# Patient Record
Sex: Male | Born: 1935 | Race: Asian | Hispanic: No | Marital: Married | Smoking: Former smoker
Health system: Southern US, Community
[De-identification: ages and names within clinical notes are randomized; demographics above are authoritative.]

## PROBLEM LIST (undated history)

## (undated) DIAGNOSIS — T8859XA Other complications of anesthesia, initial encounter: Secondary | ICD-10-CM

## (undated) DIAGNOSIS — T4145XA Adverse effect of unspecified anesthetic, initial encounter: Secondary | ICD-10-CM

## (undated) DIAGNOSIS — IMO0001 Reserved for inherently not codable concepts without codable children: Secondary | ICD-10-CM

## (undated) DIAGNOSIS — I10 Essential (primary) hypertension: Secondary | ICD-10-CM

## (undated) DIAGNOSIS — M1990 Unspecified osteoarthritis, unspecified site: Secondary | ICD-10-CM

## (undated) HISTORY — PX: CHOLECYSTECTOMY: SHX55

## (undated) HISTORY — DX: Essential (primary) hypertension: I10

---

## 1978-12-21 HISTORY — PX: INGUINAL HERNIA REPAIR: SUR1180

## 1998-04-01 ENCOUNTER — Ambulatory Visit (HOSPITAL_COMMUNITY): Admission: RE | Admit: 1998-04-01 | Discharge: 1998-04-01 | Payer: Self-pay | Admitting: Infectious Diseases

## 2000-03-31 ENCOUNTER — Inpatient Hospital Stay (HOSPITAL_COMMUNITY): Admission: EM | Admit: 2000-03-31 | Discharge: 2000-04-03 | Payer: Self-pay | Admitting: Emergency Medicine

## 2000-03-31 ENCOUNTER — Encounter: Payer: Self-pay | Admitting: Emergency Medicine

## 2000-03-31 ENCOUNTER — Encounter: Payer: Self-pay | Admitting: Internal Medicine

## 2002-12-21 HISTORY — PX: CHOLECYSTECTOMY, LAPAROSCOPIC: SHX56

## 2005-01-15 ENCOUNTER — Encounter (INDEPENDENT_AMBULATORY_CARE_PROVIDER_SITE_OTHER): Payer: Self-pay | Admitting: *Deleted

## 2005-01-15 ENCOUNTER — Ambulatory Visit (HOSPITAL_COMMUNITY): Admission: RE | Admit: 2005-01-15 | Discharge: 2005-01-15 | Payer: Self-pay | Admitting: Gastroenterology

## 2005-07-31 ENCOUNTER — Ambulatory Visit (HOSPITAL_COMMUNITY): Admission: RE | Admit: 2005-07-31 | Discharge: 2005-07-31 | Payer: Self-pay | Admitting: Internal Medicine

## 2005-09-10 ENCOUNTER — Encounter: Admission: RE | Admit: 2005-09-10 | Discharge: 2005-12-09 | Payer: Self-pay | Admitting: Specialist

## 2006-12-21 HISTORY — PX: COLONOSCOPY: SHX174

## 2008-11-19 ENCOUNTER — Ambulatory Visit: Payer: Self-pay | Admitting: Internal Medicine

## 2008-11-28 ENCOUNTER — Ambulatory Visit: Payer: Self-pay

## 2008-11-28 ENCOUNTER — Ambulatory Visit: Payer: Self-pay | Admitting: Internal Medicine

## 2008-11-28 LAB — CONVERTED CEMR LAB
Cholesterol: 207 mg/dL (ref 0–200)
Direct LDL: 159.7 mg/dL
HDL: 31.5 mg/dL — ABNORMAL LOW (ref >39.0)
Total CHOL/HDL Ratio: 6.6
Triglycerides: 92 mg/dL (ref 0–149)
VLDL: 18 mg/dL (ref 0–40)

## 2008-11-30 ENCOUNTER — Encounter: Payer: Self-pay | Admitting: Internal Medicine

## 2008-11-30 ENCOUNTER — Ambulatory Visit: Payer: Self-pay

## 2011-05-05 NOTE — Assessment & Plan Note (Signed)
[facility]                            CARDIOLOGY OFFICE NOTE   NAME:[name], Jeffrey                        MRN:          [id number]  DATE:11/19/2008                            DOB:          1936/08/28    IDENTIFICATION:  [name] is a 75 year old gentleman, who is referred by  Marisue Brooklyn for evaluation of shortness of breath.   HISTORY OF PRESENT ILLNESS:  The patient has no known history of  coronary artery disease.  He works at [employer] in [employer].  One day  at work, he was pushing a heavy load and became short of breath and felt  his heart beating fast.  He states he has noted that when he walks  quickly, he gets tired out, short of breath.  He does not climb stairs  and when I asked him if he gets any chest pressure, he denies this.  He  states about 6 or 12 months ago, he did not have problems like he has  now.  He has not woken up with any of this.  He denies any presyncope or  syncope.   ALLERGIES:  None.   MEDICATIONS:  Nasal spray for congestion nightly.   PAST MEDICAL HISTORY:  Allergies.   PAST SURGICAL HISTORY:  Cholecystectomy and hernia repair.   SOCIAL HISTORY:  The patient is married, works in Pharmacologist at [employer]  Quit tobacco in 1999 after smoking two cigarettes per day for  10-15 years.  Drinks rarely.   FAMILY HISTORY:  Mother died at age 76 of an appendicitis.  Father died  at age 49 of old age.  One sister died at age 73.  One brother died at  age 6.  One brother died at age 5 of cardiac arrest; 2 died at young  age, one in the war and one killed.  Family history without any sudden  cardiac death besides the one sibling.   REVIEW OF SYSTEMS:  All systems reviewed.  Negative to the above problem  except as noted.   PHYSICAL EXAMINATION:  GENERAL:  The patient is in no distress at rest.  VITAL SIGNS:  His blood pressure is 149/78, pulse is 72 and regular, and  weight 145.  HEENT:  Normocephalic and  atraumatic.  EOMI.  PERRL.  Mucous membranes  are moist.  NECK:  JVP is normal.  No thyromegaly or bruits.  No lymphadenopathy.  LUNGS:  Clear without rales or wheezes.  Moving air well.  CARDIAC:  Regular rate and rhythm.  S1 and S2.  No S3, S4, or murmurs  noted.  PMI not displaced.  ABDOMEN:  Supple and nontender.  Normal bowel sounds.  No hepatomegaly.  No masses.  EXTREMITIES:  Good distal pulses throughout.  No lower extremity edema.  NEUROLOGIC:  Alert and oriented x3.  Cranial nerves II through XII  grossly intact.  Moving all extremities.   A 12-lead EKG shows normal sinus rhythm, 72 beats per minute.  Incomplete right bundle-branch block.  QT is normal.   IMPRESSION:  [name] is a 75 year old gentleman  with no known cardiac  history, noting increased shortness of breath recently and his heart  racing with activity.  On examination today, I do not see any evidence  for congestive heart failure.  EKG is relatively nondiagnostic with his  family history of sudden death, I do not see any lengthening of the QT  interval.   His risk factors include age plus/minus family history.  He does note  his cholesterol has been high, checked at recent hospital screening.  What I have recommended is:  1. Schedule an echocardiogram to evaluate left ventricular size,      function, also to evaluate diastolic function.  2. Stress Myoview to evaluate his coronary perfusion.  3. A fasting lipid panel.  I will be in touch with the patient      regarding the results.   ADDENDUM:  The patient to continue on activities as tolerated only, also  to start a baby aspirin.     Pricilla Riffle, MD, [facility]  Electronically Signed    PVR/MedQ  DD: 11/19/2008  DT: 11/20/2008  Job #: [id number]   cc:   Lovenia Kim, D.O.

## 2011-05-08 NOTE — H&P (Signed)
[place]. [facility]  Patient:    Jeffrey [name], Jeffrey [name]                        MRN: [id number] Adm. Date:  [id number] Attending:  Dolores Patty CC:         Mahala Menghini. Evonnie Dawes, M.D. [facility]                         History and Physical  CHIEF COMPLAINT: Jeffrey [name] is a 75 year old [place] gentleman who is employed at [employer], who presents with constant abdominal pain for two days.  HISTORY OF PRESENT ILLNESS: This usually is epigastric with minimal radiation although in the morning he has noted some superior radiation.  He had vomiting f clear material this morning but otherwise denies significant nausea or diaphoresis. He rates this as a 5 on a scale of 1-10, of moderate severity.  It seems to be aggravated with upright position, walking, or cough.  It is minimally improved ith food intake or antacids.  He denies frank chest pain or anginal equivalent type symptoms.  He denies rectal bleeding, melena, or bowel change.  PAST MEDICAL HISTORY: Includes herniorrhaphy.  MEDICATIONS: He is on no medications.  It is difficult to obtain a history.  Although he is motivated and extremely polite and his English is good, at times he did not appear to understand my questions.  Apparently he had been treated with some "stomach medicine" by Dr. Evonnie Dawes in the  recent past.  ALLERGIES: No known drug allergies.  SOCIAL HISTORY: He does not smoke.  He drinks one alcoholic beverage a month on  average.  He has been in the [place] for two years, coming here to live to be near his daughter.  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  REVIEW OF SYSTEMS: Includes recent respiratory tract infection.  At this time he denies any fever, chills, sweats, or purulent secretions.  He denies any genitourinary symptoms.  He has had no rash and, as stated, no diarrhea.  PHYSICAL EXAMINATION:  GENERAL: On examination he is in no acute  distress.  VITAL SIGNS: Pulse 57 and regular, blood pressure 147/85, respiratory rate 18.  HEENT/NECK: No lymphadenopathy of head, neck, or axilla.  Thyroid is small. Funduscopic examination revealed essentially normal vessels.  Oropharyngeal and  laryngeal examination essentially negative.  He has an upper dental plate and lower partial.  CARDIAC: Grade 1 holosystolic murmur at the left sternal border.  CHEST: Essentially clear.  ABDOMEN: Bowel sounds present.  He is tender in the epigastrium.  There is no organomegaly and no aneurysm noted.  A left femoral bruit is noted.  EXTREMITIES: Pedal pulses are intact and there is no edema.  He has full range f motion of all extremities.  SKIN: There is a polypoid lesion on the buttock.  GU: Prostate is mildly enlarged with slight irregularity, pseudo-nodular character, right inferior pole.  RECTAL: Hemoccult testing is equivocal.  LABORATORY DATA: EKG reveals sinus bradycardia.  There is a tall R in V2, V3 suggestive of possible old inferior infarct.  There are nonspecific ST-T wave changes.  IMPRESSION:  Admission was requested for possible rule out myocardial infarction. Clinical history suggests that the epigastric pain may be related to ulcers or gallbladder disease.  PLAN: He will be admitted to telemetry with cardiac enzymes obtained.  Amylase,  lipase, and H. pylori will be collected.  He will  be placed on Protonix.  Fecal  occult blood studies will be collected.  If there is no MI then he could be worked up as an outpatient for gastroenterologic evaluation.  An acute abdominal series and ultrasound will also be performed. DD:  03/31/00 TD:  04/01/00 Job: [id number]

## 2011-05-08 NOTE — Op Note (Signed)
[name], [name] NO.:  [id number]   MEDICAL RECORD NO.:  [id number]          PATIENT TYPE:  AMB   LOCATION:  ENDO                         FACILITY:  [facility]   PHYSICIAN:  Bernette Redbird, M.D.   DATE OF BIRTH:  Mar 22, 1936   DATE OF PROCEDURE:  01/15/2005  DATE OF DISCHARGE:                                 OPERATIVE REPORT   PROCEDURE:  Colonoscopy with biopsy.   ENDOSCOPIST:  Bernette Redbird, M.D.   INDICATION:  A very pleasant 75 year old for colon cancer screening.   FINDINGS:  Diminutive polyp in the ascending colon.   PROCEDURE:  The nature, purpose, risks of the procedure had been reviewed  with the patient, who provided written consent. Sedation was fentanyl 100  mcg and Versed 10 mg IV prior to and during the course the procedure without  arrhythmias or desaturation.   The Olympus adjustable tension pediatric video colonoscope was advanced with  moderate difficulty to the cecum, as identified by clear visualization of  the appendiceal orifice and ileocecal valve. Pullback was then performed.  The terminal ileum was not entered.   Advancement was difficult primarily due to looping and it is felt that  possibly the adult colonoscope might be preferable on subsequent occasions.   With a combination of the patient in a supine position, external abdominal  compression, overriding loops and taking out loops, we were able to achieve  advancement as described.   The quality of prep on this exam was very good and it is felt that all that  essentially all areas were well seen.   In the proximal ascending colon was a 3-mm sessile polyp removed by 2 cold  biopsies. No other polyps were seen and there was no evidence of cancer,  colitis, vascular malformations or diverticulosis. Retroflexion in the  rectum and reinspection of the rectum were unremarkable, as was digital exam  of the prostate gland.   The patient tolerated the procedure well and there no  apparent  complications.   IMPRESSION:  Solitary diminutive sessile polyp, removed as described above  (211.3).   PLAN:  Await polyp pathology with followup colonoscopy in 5 years if it is  adenomatous in character, otherwise consider flexible sigmoidoscopy in 5  years for continued routine screening.      RB/MEDQ  D:  01/15/2005  T:  01/15/2005  Job:  [id number]   cc:   Lovenia Kim, D.O.  [address]  [place]  [place] [address]  Fax: [phone]

## 2011-05-08 NOTE — Op Note (Signed)
[place]  Patient:    Jeffrey [name], Jeffrey [name]                        MRN: [id number] Proc. Date: 04/01/00 Adm. Date:  [id number] Attending:  Brandy Hale CC:         Titus Dubin. Alwyn Ren, M.D. [facility]                           Operative Report  PREOPERATIVE DIAGNOSIS:  Subacute cholecystitis with cholelithiasis.  POSTOPERATIVE DIAGNOSIS:  Subacute cholecystitis with cholelithiasis.  OPERATION PERFORMED:  Laparoscopic cholecystectomy.  SURGEON:  Angelia Mould. Derrell Lolling, M.D.  FIRST ASSISTANT:  Milus Mallick, M.D.  OPERATIVE INDICATIONS:  This is a 75 year old, [place] gentleman who was admitted to this hospital on March 31, 2000 with epigastric pain, nausea, and vomiting since March 30, 2000.  He had one episode of bilious vomiting two weeks ago.  He does not have any chronic pain, but he does have occasional ______ with fatty meals.  No other GI problems, and the pain is now essentially resolved.  PHYSICAL EXAMINATION:  ABDOMEN:  Soft with mild epigastric tenderness.  LABORATORY DATA:  Normal liver function tests.  Normal amylase.  Normal white blood cell count.  Ultrasound showed numerous gallstones and a suggestion of gallbladder wall thickening.  The common bile duct was not dilated.  He has been evaluated by Dr. Alwyn Ren and by cardiology, and they feel that he does not have a cardiac origin of his pain.  I saw Jeffrey [name] earlier today, and felt that he had a resolving attack of cholecystitis.  A cholecystectomy was recommended, although it was not felt to be emergent.  He wanted to go ahead and have this done today, and not risk any future attacks.  OPERATIVE FINDINGS:  The gallbladder was subacutely inflamed, somewhat edematous and friable.  The anatomy of the cystic duct, cystic artery, and common bile duct were conventional.  The liver appeared healthy.  The stomach and duodenum appeared normal.  The large intestines and the small  intestines were grossly normal to inspection.  There were no ascites.  There were no abnormalities of the peritoneal surface.  DESCRIPTION OF PROCEDURE:  Following the induction of general endotracheal anesthesia, the patients abdomen was prepped and draped in a sterile fashion. A vertical incision was made inside the lower rim of the umbilicus.  The fascia was incised in the midline, and the abdominal cavity entered under direct vision.  A 10 mm Hasson trocar was inserted and secured with a purse-string suture of #0 Vicryl.  Pneumoperitoneum was treated.  Video camera was inserted, and the visualization and findings were as described above.  A 10 mm trocar was placed in the subxiphoid region, and two 5 mm trocars were placed in the right mid abdomen.  The gallbladder was placed on traction.  He had fairly extensive adhesions, and these were taken down.  We dissected out the body and then the infundibulum of the gallbladder.  We dissected out the cystic duct and the cystic artery.  The cystic artery was secured with metal clips and divided.  We dissected out a generous length of the cystic duct, and we were able to retract it completely laterally, and we were able to clearly identify the junction of the cystic duct with the gallbladder and also to identify the region of the common bile duct.  The cystic duct was  then secured with metal clips and divided.  The gallbladder was dissected from its bed with electrocautery, placed into the specimen bag, and removed through the umbilical port.  The operative field was copiously irrigated.  Hemostasis was excellent in the bed of the gallbladder.  There was a tiny capsular abrasion of the liver just to the right of the falciform ligament.  This was controlled with electrocautery and Surgicel.  At the completion of the case, there was no bleeding and no bile leak what-so-ever.  The subphrenic space was irrigated. The trocars were removed under  direct vision, and there was no bleeding from the trocar sites.  The pneumoperitoneum was released.  The fascia at the umbilicus was closed with #0 Vicryl sutures.  The skin incisions were closed with subcuticular sutures of 4-0 Vicryl and Steri-Strips.  Clean bandages were placed, and the patient was taken to the recovery room in stable condition. Estimated blood loss was about 10-15 cc.  Complications were none.  Sponge, needle, and instrument counts were correct. DD:  04/01/00 TD:  04/02/00 Job: [id number]

## 2011-05-08 NOTE — Discharge Summary (Signed)
[place]. [facility]  Patient:    Jeffrey [name], Jeffrey [name]                        MRN: [id number] Adm. Date:  [id number] Disc. Date: [id number] Attending:  Brandy Hale CC:         Titus Dubin. Alwyn Ren, M.D. [facility]             Peter C. Eden Emms, M.D. [facility]                           Discharge Summary  FINAL DIAGNOSES: 1. Chronic cholecystitis. 2. Adenomyoma of the gallbladder.  OPERATIONS PERFORMED:  Laparoscopic cholecystectomy.  HISTORY OF PRESENT ILLNESS:  This is a 75 year old [place] gentleman admitted on March 31, 2000, by Dr. Marga Melnick.  The patient had epigastric pain, nausea, and vomiting since April 10.  He had had one episode of vomiting bowel two weeks previously.  No chronic pain, no chronic GI problems.  He has occasional diarrhea after fatty meals but no other GI problems.  He was admitted by Dr. Marga Melnick.  Cardiac enzymes were negative.  He became pain-free within 12 hours.  For details of his past history, social history, family history, please see the detailed admission note.  PHYSICAL EXAMINATION:  GENERAL:  Alert, thin [place] gentleman in no distress.  LUNGS:  Clear to auscultation.  HEART:  Regular rate and rhythm, no murmur.  ABDOMEN:  Soft.  Not distended.  Active bowel sounds.  Mild epigastric tenderness.  ADMISSION DATA:  Ultrasound shows gallstones and the question of gallbladder wall thickening, and common bile duct measured 3.4 mm.  Liver function tests normal.  Serum amylase and serum lipase normal.  Hemoglobin 16, white count 6500.  HOSPITAL COURSE:  The patient was admitted by Dr. Marga Melnick.  Serial EKGs and cardiac enzymes essentially ruled out myocardial infarction.  The patient was seen in consultation by the cardiology service.  Dr. Charlton Haws did not think that the patient had cardiac disease and did not think the patient needed Cardiolite studies and cleared the patient for surgery without  any further evaluation.  Patient was taken to the operating room on April 12.  Clinically, the only abnormal finding was suggestion of subacute cholecystitis with cholelithiasis. Gallbladder did look a little bit edematous.  Final pathology report showed cholecystitis and an adenomyoma of the gallbladder.  Postoperatively, the patient did relatively well.  He had some nausea and dizziness for the first 24 hours.  He also had some headache and, so, he was kept in the hospital for another 24 hours.  The patient did well and was discharged on April 14.  At that time, he was eating okay and felt ready to go home.  His diet and activities were discussed.  He was asked to return to see me in the office in two to three weeks. DD:  04/26/00 TD:  04/27/00 Job: [id number]

## 2011-09-28 ENCOUNTER — Encounter: Payer: Self-pay | Admitting: Family Medicine

## 2011-09-28 ENCOUNTER — Ambulatory Visit (INDEPENDENT_AMBULATORY_CARE_PROVIDER_SITE_OTHER): Payer: Self-pay | Admitting: Family Medicine

## 2011-09-28 VITALS — BP 159/74 | HR 79 | Temp 97.6°F | Ht 63.75 in | Wt 156.6 lb

## 2011-09-28 DIAGNOSIS — Z23 Encounter for immunization: Secondary | ICD-10-CM

## 2011-09-28 DIAGNOSIS — I10 Essential (primary) hypertension: Secondary | ICD-10-CM

## 2011-09-28 DIAGNOSIS — Z0000 Encounter for general adult medical examination without abnormal findings: Secondary | ICD-10-CM

## 2011-09-28 MED ORDER — PNEUMOCOCCAL VAC POLYVALENT 25 MCG/0.5ML IJ INJ: 0.5000 mL | INJECTION | Freq: Once | INTRAMUSCULAR | Status: DC

## 2011-09-28 MED ORDER — AMLODIPINE-OLMESARTAN 10-40 MG PO TABS: 1.0000 | ORAL_TABLET | Freq: Every day | ORAL | Status: DC

## 2011-09-28 MED ORDER — ASPIRIN EC 81 MG PO TBEC: 81.0000 mg | DELAYED_RELEASE_TABLET | Freq: Every day | ORAL | Status: AC

## 2011-09-28 NOTE — Assessment & Plan Note (Signed)
All health maintenance items completed. No colonoscopy needed No prostatic symptoms or signs Will check lipid panel, last check unknown. HTN main issue Started ASA 81 mg. No bleeding hx.

## 2011-09-28 NOTE — Progress Notes (Signed)
  Subjective:    Patient ID: Jeffrey [name], male    DOB: Aug 14, 1936, 75 y.o.   MRN: [id number]  HPI 1. Health Maintenance Patient is new to our practice. Full history and physical performed. No complaints at this time. Reports having prostatic bx done at cancer center, but unknown reason, said results were normal.  2. HTN Patient states he is systolic in the 160's at times. He is being treated with Azor 0.5 tabs 10-40's daily. He is compliant. He does not eat a lot of salt. He has no headaches, visual changes.   Review of Systems  Constitutional: Negative for fever, chills, activity change, appetite change, fatigue and unexpected weight change.  Eyes: Negative for visual disturbance.  Respiratory: Negative for apnea, cough, chest tightness and shortness of breath.   Cardiovascular: Negative for chest pain, palpitations and leg swelling.  Gastrointestinal: Negative for abdominal pain, diarrhea, constipation, blood in stool and rectal pain.  Genitourinary: Negative for dysuria, urgency, frequency and difficulty urinating.  Musculoskeletal: Positive for arthralgias. Negative for back pain and joint swelling.  Skin: Negative for rash.  Neurological: Negative for syncope and headaches.       Objective:   Physical Exam  Nursing note and vitals reviewed. Constitutional: He appears well-developed and well-nourished. No distress.  HENT:  Head: Normocephalic and atraumatic.  Eyes: Conjunctivae are normal. Pupils are equal, round, and reactive to light.  Neck: Normal range of motion. Neck supple. No JVD present. No thyromegaly present.  Cardiovascular: Normal rate and regular rhythm.   Pulmonary/Chest: Effort normal and breath sounds normal. No respiratory distress.  Abdominal: Soft. He exhibits no distension and no mass. There is no tenderness. There is no rebound and no guarding.  Musculoskeletal: Normal range of motion. He exhibits no edema and no tenderness.  Lymphadenopathy:    He  has no cervical adenopathy.  Neurological: He is alert.  Skin: Skin is warm. No rash noted. He is not diaphoretic.      Assessment & Plan:

## 2011-09-28 NOTE — Patient Instructions (Signed)
Hypertension Information  As your heart beats, it forces blood through your arteries. This force is your blood pressure. If the pressure is too high, it is called hypertension (HTN) or high blood pressure. HTN is dangerous because you may have it and not know it. High blood pressure may mean that your heart has to work harder to pump blood. Your arteries may be narrow or stiff. The extra work puts you at risk for heart disease, stroke, and other problems.    Blood pressure consists of two numbers, a higher number over a lower, 110/72, for example. It is stated as "110 over 72." The ideal is below 120 for the top number (systolic) and under 80 for the bottom (diastolic).    You should pay close attention to your blood pressure if you have certain conditions such as:   Heart failure.    Prior heart attack.      Diabetes    Chronic kidney disease.      Prior stroke.      Multiple risk factors for heart disease.      To see if you have HTN, your blood pressure should be measured while you are seated with your arm held at the level of the heart. It should be measured at least twice. A one-time elevated blood pressure reading (especially in the Emergency Department) does not mean that you need treatment. There may be conditions in which the blood pressure is different between your right and left arms. It is important to see your caregiver soon for a recheck.  Most people have essential hypertension which means that there is not a specific cause. This type of high blood pressure may be lowered by changing lifestyle factors such as:   Stress.    Smoking.     Lack of exercise.     Excessive weight.    Drug/tobacco/alcohol use.      Eating less salt.      Most people do not have symptoms from high blood pressure until it has caused damage to the body. Effective treatment can often prevent, delay or reduce that damage.  TREATMENT   Treatment for high blood pressure, when a cause has been identified, is directed at the cause. There are a large number of medications to treat HTN. These fall into several categories, and your caregiver will help you select the medicines that are best for you. Medications may have side effects. You should review side effects with your caregiver.  If your blood pressure stays high after you have made lifestyle changes or started on medicines,     Your medication(s) may need to be changed.    Other problems may need to be addressed.    Be certain you understand your prescriptions, and know how and when to take your medicine.    Be sure to follow up with your caregiver within the time frame advised (usually within two weeks) to have your blood pressure rechecked and to review your medications.    If you are taking more than one medicine to lower your blood pressure, make sure you know how and at what times they should be taken. Taking two medicines at the same time can result in blood pressure that is too low.   Document Released: 02/09/2006 Document Re-Released: 03/03/2010  [employer] Patient Information 2011 [employer].

## 2011-09-28 NOTE — Assessment & Plan Note (Signed)
Increased azor to whole tab daily instead of half a tab. Pt. Will follow up in one month for recheck

## 2011-09-28 NOTE — Progress Notes (Signed)
Addended by: Edd Arbour on: 09/28/2011 12:18 PM   Modules accepted: Orders

## 2011-09-28 NOTE — Progress Notes (Signed)
Addended by: Deno Etienne on: 09/28/2011 11:31 AM   Modules accepted: Orders

## 2012-07-11 ENCOUNTER — Other Ambulatory Visit: Payer: Self-pay | Admitting: Cardiovascular Disease

## 2012-07-11 ENCOUNTER — Ambulatory Visit
Admission: RE | Admit: 2012-07-11 | Discharge: 2012-07-11 | Disposition: A | Discharge status: Home / Self Care | Payer: No Typology Code available for payment source | Source: Ambulatory Visit | Attending: Cardiovascular Disease | Admitting: Cardiovascular Disease

## 2012-07-11 DIAGNOSIS — M549 Dorsalgia, unspecified: Secondary | ICD-10-CM

## 2012-07-11 DIAGNOSIS — R109 Unspecified abdominal pain: Secondary | ICD-10-CM

## 2013-09-21 ENCOUNTER — Ambulatory Visit: Payer: Self-pay | Admitting: Family Medicine

## 2013-09-22 ENCOUNTER — Ambulatory Visit (INDEPENDENT_AMBULATORY_CARE_PROVIDER_SITE_OTHER): Payer: Self-pay | Admitting: Family Medicine

## 2013-09-22 ENCOUNTER — Encounter: Payer: Self-pay | Admitting: Family Medicine

## 2013-09-22 ENCOUNTER — Ambulatory Visit
Admission: RE | Admit: 2013-09-22 | Discharge: 2013-09-22 | Disposition: A | Discharge status: Home / Self Care | Payer: No Typology Code available for payment source | Source: Ambulatory Visit | Attending: Family Medicine | Admitting: Family Medicine

## 2013-09-22 VITALS — BP 145/58 | HR 65 | Temp 98.6°F | Ht 63.75 in | Wt 153.0 lb

## 2013-09-22 DIAGNOSIS — R319 Hematuria, unspecified: Secondary | ICD-10-CM

## 2013-09-22 DIAGNOSIS — R109 Unspecified abdominal pain: Secondary | ICD-10-CM

## 2013-09-22 LAB — POCT URINALYSIS DIPSTICK
Leukocytes, UA: NEGATIVE
Nitrite, UA: NEGATIVE
Protein, UA: NEGATIVE
pH, UA: 5.5

## 2013-09-22 LAB — POCT UA - MICROSCOPIC ONLY

## 2013-09-22 LAB — BASIC METABOLIC PANEL
Calcium: 9.8 mg/dL (ref 8.4–10.5)
Creat: 0.94 mg/dL (ref 0.50–1.35)

## 2013-09-22 NOTE — Patient Instructions (Addendum)

## 2013-09-22 NOTE — Assessment & Plan Note (Signed)
See plan for flank pain

## 2013-09-22 NOTE — Progress Notes (Signed)
Subjective:     Patient ID: Jeffrey [name], male   DOB: Jul 16, 1936, 77 y.o.   MRN: [id number]  HPI  77 yo who presents for back pain  - every morning when he wakes up he feels low back pain and tightness -works in his garden and has trouble when he bends over - when he stands it does well overall - started about 1 month ago - staying the same, not worsening - better with heat and rubbing occasionally but not always  - no fevers, chills, dysuria, hematuria, no abd pain. No bowel changes. No numbness, weakness.   - some urinary leaking over last year - no nocturia - no weight loss  Past Medical History  Diagnosis Date  . HTN (hypertension)    Past Surgical History  Procedure Laterality Date  . Cholecystectomy, laparoscopic  2004  . Inguinal hernia repair  1980  . Colonoscopy  2008    normal, no polyps, pt. reported.   Family History  Problem Relation Age of Onset  . Hypertension Sister   . Heart failure Brother   . Hyperlipidemia Daughter   . Asthma Daughter    History   Social History  . Marital Status: Married    Spouse Name: N/A    Number of Children: N/A  . Years of Education: N/A   Occupational History  . Not on file.   Social History Main Topics  . Smoking status: Former Smoker -- 0.60 packs/day for 42 years    Types: Cigarettes    Quit date: 09/27/1998  . Smokeless tobacco: Not on file  . Alcohol Use: Yes  . Drug Use: No  . Sexual Activity: Yes    Partners: Female   Other Topics Concern  . Not on file   Social History Narrative  . No narrative on file   Current Outpatient Prescriptions on File Prior to Visit  Medication Sig Dispense Refill  . amLODipine-olmesartan (AZOR) 10-40 MG per tablet Take 1 tablet by mouth daily.  30 tablet  3   Current Facility-Administered Medications on File Prior to Visit  Medication Dose Route Frequency Provider Last Rate Last Dose  . pneumococcal 23 valent vaccine (PNU-IMMUNE) injection 0.5 mL  0.5 mL  Intramuscular Once Edd Arbour, MD         Review of Systems See above     Objective:   Physical Exam Filed Vitals:   09/22/13 0922  BP: 145/58  Pulse: 65  Temp: 98.6 F (37 C)  TempSrc: Oral  Height: 5' 3.75" (1.619 m)  Weight: 153 lb (69.4 kg)   Gen: alert, well appearing male in NAD HEENT: PERRL CV: RRR, normal s1 and s2 PULM: LCTAB ABD: soft, NT, ND, +BS Back: mildly tender at bilateral CVA and not with palpation of paraspinals. Full ROM without ellicited pain EXT: normal gait    Assessment:     Flank pain - Plan: POCT urinalysis dipstick, Basic metabolic panel, US Renal, POCT UA - Microscopic Only  Hematuria - Plan: Basic metabolic panel, US Renal, POCT UA - Microscopic Only      Plan:     See assessment and plan section

## 2013-09-22 NOTE — Assessment & Plan Note (Signed)
-  bilateral mild flank pain and in the setting of mild hematuria on urine dip and microscopy.  Story most concerning for possible nonobstructing nephrolithiasis given hx and degree of pain.  Could also be MSK strain although no motion reproduced symptoms.  Also considered prostate etiology whether bph or otherwise but pt has absolutely no nocturia or difficulty maintaining a stream. Some dribbling occasionally when he holds it too long.  Thus this is far down my differential.  - pt with no insurance so will attempt to keep workup as cheap as possible - rx of flomax for flow - check BMP for renal function (has not been seen in our clinic for 2 years) - check renal US for evidence of hydronephrosis, stone or mass effect.   Discussed all above with the pt and with his daughter, Okey Dupre, over the phone.

## 2013-09-26 ENCOUNTER — Telehealth: Payer: Self-pay | Admitting: *Deleted

## 2013-09-26 NOTE — Telephone Encounter (Signed)
Message copied by Henri Medal on Tue Sep 26, 2013  3:54 PM ------      Message from: [place]      Created: Tue Sep 26, 2013  3:46 PM       Hi ladies, please let him know that his kidney function was great on labs and there is no evidence of a kidney stone.  He does have a few cysts on his kidneys but they do not feel that they are abnormal and they have likely been there for a long time.  The recommendation would be that should he get insurance, we could repeat the ultrasound in around 6 months and make sure the cysts haven't changed size. If he has any questions, I can try and give him a call later this week.  Thanks. ------

## 2013-09-26 NOTE — Telephone Encounter (Signed)
Pt is aware.  Kerolos Nehme,CMA  

## 2015-08-14 ENCOUNTER — Other Ambulatory Visit: Payer: Self-pay | Admitting: Cardiovascular Disease

## 2015-08-14 ENCOUNTER — Ambulatory Visit
Admission: RE | Admit: 2015-08-14 | Discharge: 2015-08-14 | Disposition: A | Discharge status: Home / Self Care | Payer: Self-pay | Source: Ambulatory Visit | Attending: Cardiovascular Disease | Admitting: Cardiovascular Disease

## 2015-08-14 DIAGNOSIS — R0602 Shortness of breath: Secondary | ICD-10-CM

## 2015-08-22 ENCOUNTER — Encounter (HOSPITAL_COMMUNITY): Payer: Self-pay | Admitting: General Practice

## 2015-08-22 ENCOUNTER — Inpatient Hospital Stay (HOSPITAL_COMMUNITY): Payer: Medicare Other

## 2015-08-22 ENCOUNTER — Inpatient Hospital Stay (HOSPITAL_COMMUNITY)
Admission: AD | Admit: 2015-08-22 | Discharge: 2015-08-24 | DRG: 167 | Disposition: A | Discharge status: Home / Self Care | Payer: Medicare Other | Source: Ambulatory Visit | Attending: Cardiovascular Disease | Admitting: Cardiovascular Disease

## 2015-08-22 DIAGNOSIS — R042 Hemoptysis: Secondary | ICD-10-CM | POA: Diagnosis not present

## 2015-08-22 DIAGNOSIS — K219 Gastro-esophageal reflux disease without esophagitis: Secondary | ICD-10-CM | POA: Diagnosis present

## 2015-08-22 DIAGNOSIS — Z87891 Personal history of nicotine dependence: Secondary | ICD-10-CM

## 2015-08-22 DIAGNOSIS — R599 Enlarged lymph nodes, unspecified: Secondary | ICD-10-CM | POA: Diagnosis not present

## 2015-08-22 DIAGNOSIS — R918 Other nonspecific abnormal finding of lung field: Secondary | ICD-10-CM

## 2015-08-22 DIAGNOSIS — J439 Emphysema, unspecified: Secondary | ICD-10-CM | POA: Diagnosis present

## 2015-08-22 DIAGNOSIS — R590 Localized enlarged lymph nodes: Secondary | ICD-10-CM

## 2015-08-22 DIAGNOSIS — I10 Essential (primary) hypertension: Secondary | ICD-10-CM | POA: Diagnosis present

## 2015-08-22 DIAGNOSIS — Z79899 Other long term (current) drug therapy: Secondary | ICD-10-CM

## 2015-08-22 DIAGNOSIS — C3412 Malignant neoplasm of upper lobe, left bronchus or lung: Principal | ICD-10-CM | POA: Diagnosis present

## 2015-08-22 DIAGNOSIS — R0602 Shortness of breath: Secondary | ICD-10-CM | POA: Diagnosis present

## 2015-08-22 DIAGNOSIS — Z9889 Other specified postprocedural states: Secondary | ICD-10-CM

## 2015-08-22 HISTORY — DX: Other complications of anesthesia, initial encounter: T88.59XA

## 2015-08-22 HISTORY — DX: Adverse effect of unspecified anesthetic, initial encounter: T41.45XA

## 2015-08-22 HISTORY — DX: Reserved for inherently not codable concepts without codable children: IMO0001

## 2015-08-22 HISTORY — DX: Unspecified osteoarthritis, unspecified site: M19.90

## 2015-08-22 LAB — CBC
HEMATOCRIT: 41.4 % (ref 39.0–52.0)
HEMOGLOBIN: 13.8 g/dL (ref 13.0–17.0)
MCH: 31.4 pg (ref 26.0–34.0)
MCHC: 33.3 g/dL (ref 30.0–36.0)
MCV: 94.3 fL (ref 78.0–100.0)
Platelets: 177 10*3/uL (ref 150–400)
RBC: 4.39 MIL/uL (ref 4.22–5.81)
RDW: 13 % (ref 11.5–15.5)
WBC: 5.4 10*3/uL (ref 4.0–10.5)

## 2015-08-22 LAB — BASIC METABOLIC PANEL
Anion gap: 5 (ref 5–15)
BUN: 17 mg/dL (ref 6–20)
CHLORIDE: 104 mmol/L (ref 101–111)
CO2: 26 mmol/L (ref 22–32)
Calcium: 9 mg/dL (ref 8.9–10.3)
Creatinine, Ser: 1.02 mg/dL (ref 0.61–1.24)
GFR calc Af Amer: >60 mL/min (ref >60)
GFR calc non Af Amer: >60 mL/min (ref >60)
Glucose, Bld: 122 mg/dL — ABNORMAL HIGH (ref 65–99)
POTASSIUM: 3.8 mmol/L (ref 3.5–5.1)
SODIUM: 135 mmol/L (ref 135–145)

## 2015-08-22 LAB — TROPONIN I: Troponin I: <0.03 ng/mL (ref <0.031)

## 2015-08-22 LAB — BRAIN NATRIURETIC PEPTIDE: B Natriuretic Peptide: 14.8 pg/mL (ref 0.0–100.0)

## 2015-08-22 MED ORDER — SODIUM CHLORIDE 0.9 % IJ SOLN: 3.0000 mL | INTRAMUSCULAR | Status: DC | PRN

## 2015-08-22 MED ORDER — OXYCODONE HCL 5 MG PO TABS: 5.0000 mg | ORAL_TABLET | ORAL | Status: DC | PRN

## 2015-08-22 MED ORDER — AMLODIPINE-OLMESARTAN 10-40 MG PO TABS: 1.0000 | ORAL_TABLET | Freq: Every day | ORAL | Status: DC

## 2015-08-22 MED ORDER — ONDANSETRON HCL 4 MG/2ML IJ SOLN: 4.0000 mg | Freq: Four times a day (QID) | INTRAMUSCULAR | Status: DC | PRN

## 2015-08-22 MED ORDER — AMLODIPINE BESYLATE 10 MG PO TABS
10.0000 mg | ORAL_TABLET | Freq: Every day | ORAL | Status: DC
  Administered 2015-08-22 – 2015-08-23 (×2): 10 mg via ORAL
  Filled 2015-08-22 (×2): qty 1

## 2015-08-22 MED ORDER — HEPARIN SODIUM (PORCINE) 5000 UNIT/ML IJ SOLN
5000.0000 [IU] | Freq: Three times a day (TID) | INTRAMUSCULAR | Status: DC
  Administered 2015-08-22 (×2): 5000 [IU] via SUBCUTANEOUS
  Filled 2015-08-22: qty 1

## 2015-08-22 MED ORDER — IRBESARTAN 300 MG PO TABS
300.0000 mg | ORAL_TABLET | Freq: Every day | ORAL | Status: DC
  Administered 2015-08-22 – 2015-08-23 (×2): 300 mg via ORAL
  Filled 2015-08-22 (×3): qty 1

## 2015-08-22 MED ORDER — SODIUM CHLORIDE 0.9 % IJ SOLN
3.0000 mL | Freq: Two times a day (BID) | INTRAMUSCULAR | Status: DC
  Administered 2015-08-23 – 2015-08-24 (×2): 3 mL via INTRAVENOUS

## 2015-08-22 MED ORDER — IOHEXOL 300 MG/ML  SOLN
75.0000 mL | Freq: Once | INTRAMUSCULAR | Status: AC | PRN
  Administered 2015-08-22: 100 mL via INTRAVENOUS

## 2015-08-22 MED ORDER — SODIUM CHLORIDE 0.9 % IJ SOLN
3.0000 mL | Freq: Two times a day (BID) | INTRAMUSCULAR | Status: DC
  Administered 2015-08-22 – 2015-08-24 (×5): 3 mL via INTRAVENOUS

## 2015-08-22 MED ORDER — ONDANSETRON HCL 4 MG PO TABS: 4.0000 mg | ORAL_TABLET | Freq: Four times a day (QID) | ORAL | Status: DC | PRN

## 2015-08-22 MED ORDER — INFLUENZA VAC SPLIT QUAD 0.5 ML IM SUSY
0.5000 mL | PREFILLED_SYRINGE | INTRAMUSCULAR | Status: DC
  Filled 2015-08-22: qty 0.5

## 2015-08-22 MED ORDER — SODIUM CHLORIDE 0.9 % IV SOLN: 250.0000 mL | INTRAVENOUS | Status: DC | PRN

## 2015-08-22 MED ORDER — ALBUTEROL SULFATE (2.5 MG/3ML) 0.083% IN NEBU
2.5000 mg | INHALATION_SOLUTION | Freq: Four times a day (QID) | RESPIRATORY_TRACT | Status: DC
  Administered 2015-08-22 – 2015-08-23 (×3): 2.5 mg via RESPIRATORY_TRACT
  Filled 2015-08-22 (×3): qty 3

## 2015-08-22 NOTE — Consult Note (Signed)
PULMONARY / CRITICAL CARE MEDICINE   Name: Geovonni Meyerhoff MRN: [id number] DOB: 12-30-1935    ADMISSION DATE:  08/22/2015 CONSULTATION DATE:  08/22/2015  REFERRING MD :  Doylene Canard  CHIEF COMPLAINT:  Lung mass  INITIAL PRESENTATION: 79 y/o male with HTN was admitted with shortness of breath by Dr. Doylene Canard, found to have a left upper lobe lung mass.  STUDIES:  9/1 CT chest> large infiltrating left upper lobe mass adjacent to the pleura, mediastinal and hilar adenopathy noted on CT chest  SIGNIFICANT EVENTS:   HISTORY OF PRESENT ILLNESS:  Mr. Dubuc was admitted by Dr. Doylene Canard today for shortness of breath.  He was found to have an abnormal CXR with a left upper lobe mass.  A CT chest was performed showing a left upper lobe mass and contralateral mediastinal and hilar lymphadenopathy. He reports that he smoked for a long time when he lived in the [place], however, he quit when he moved to the [place] in 1999. He did start smoking again in March, but only for one month, and during that time he smoked only one cigarette per day. 9/1 he presented to Cardiology office c/o SOB and epigastric pain. Onset of symptoms about 2 months ago. Epigastric pain is intermittent and alleviated by eating and drinking. SOB was mainly noted with activity such as his daily mile walks, or working in the garden. In cardiology office, CXR showed LUL mass, CT showed  large infiltrating left upper lobe mass adjacent to the pleura, mediastinal and hilar adenopathy noted on CT chest. He was admitted for further evaluation. PCCM asked to see in consultation.   PAST MEDICAL HISTORY :   has a past medical history of HTN (hypertension); Complication of anesthesia; Shortness of breath dyspnea; and Arthritis.  has past surgical history that includes Cholecystectomy, laparoscopic (2004); Inguinal hernia repair (1980); Colonoscopy (2008); and Cholecystectomy. Prior to Admission medications   Medication Sig Start Date End Date  Taking? Authorizing Provider  amLODipine-olmesartan (AZOR) 10-40 MG per tablet Take 1 tablet by mouth daily. 09/28/11   Lyndee Hensen, MD   No Known Allergies  FAMILY HISTORY:  has no family status information on file.  SOCIAL HISTORY:  reports that he quit smoking about 6 months ago. His smoking use included Cigarettes. He has a 25.2 pack-year smoking history. He has never used smokeless tobacco. He reports that he drinks alcohol. He reports that he does not use illicit drugs.  REVIEW OF SYSTEMS:   Bolds are positive  Constitutional: weight loss, gain, night sweats, Fevers, chills, fatigue .  HEENT: headaches, Sore throat, sneezing, nasal congestion, post nasal drip, Difficulty swallowing, Tooth/dental problems, visual complaints visual changes, ear ache CV:  chest pain, radiates: ,Orthopnea, PND, swelling in lower extremities, dizziness, palpitations, syncope.  GI  heartburn, indigestion, epigastric pain, nausea, vomiting, diarrhea, change in bowel habits, loss of appetite, bloody stools.  Resp: cough, productive: , hemoptysis, dyspnea with exertion, chest pain, pleuritic.  Skin: rash or itching or icterus GU: dysuria, change in color of urine, urgency or frequency. flank pain, hematuria  MS: joint pain or swelling. decreased range of motion  Psych: change in mood or affect. depression or anxiety.  Neuro: difficulty with speech, weakness, numbness, ataxia     SUBJECTIVE:   VITAL SIGNS: Temp:  [98 F (36.7 C)-98.3 F (36.8 C)] 98.3 F (36.8 C) (09/01 1638) Pulse Rate:  [71-75] 75 (09/01 1638) Resp:  [17] 17 (09/01 1638) BP: (116-130)/(55-68) 116/55 mmHg (09/01 1638) SpO2:  [94 %-96 %] 96 % (  09/01 1951) HEMODYNAMICS:   VENTILATOR SETTINGS:   INTAKE / OUTPUT:  Intake/Output Summary (Last 24 hours) at 08/22/15 2050 Last data filed at 08/22/15 1805  Gross per 24 hour  Intake    480 ml  Output      0 ml  Net    480 ml    PHYSICAL EXAMINATION: General:  Male of normal  body habitus in NAD Neuro:  Alert, oriented, non-focal HEENT:  [place]/AT, no JVD noted, PERRL Cardiovascular:  RRR, no MRG Lungs:  Clear bilateral breath sounds. Respirations even, unlabored Abdomen:  Soft, non-tender, non-distended Musculoskeletal:  No acute deformity or ROM limitation Skin: Grossly intact  LABS:  CBC  Recent Labs Lab 08/22/15 1454  WBC 5.4  HGB 13.8  HCT 41.4  PLT 177   Coag's No results for input(s): APTT, INR in the last 168 hours. BMET  Recent Labs Lab 08/22/15 1454  NA 135  K 3.8  CL 104  CO2 26  BUN 17  CREATININE 1.02  GLUCOSE 122*   Electrolytes  Recent Labs Lab 08/22/15 1454  CALCIUM 9.0   Sepsis Markers No results for input(s): LATICACIDVEN, PROCALCITON, O2SATVEN in the last 168 hours. ABG No results for input(s): PHART, PCO2ART, PO2ART in the last 168 hours. Liver Enzymes No results for input(s): AST, ALT, ALKPHOS, BILITOT, ALBUMIN in the last 168 hours. Cardiac Enzymes  Recent Labs Lab 08/22/15 1454  TROPONINI <0.03   Glucose No results for input(s): GLUCAP in the last 168 hours.  Imaging Ct Chest W Contrast  08/22/2015   CLINICAL DATA:  79 year old male with exertional shortness of breath and cough has abnormal chest x-ray. No h/o hemoptysis. No fever or chills.  EXAM: CT CHEST WITH CONTRAST  TECHNIQUE: Multidetector CT imaging of the chest was performed during intravenous contrast administration.  CONTRAST:  176m OMNIPAQUE IOHEXOL 300 MG/ML  SOLN  COMPARISON:  Chest radiograph, 08/14/2015.  FINDINGS: Thoracic inlet and axilla: Several prominent neck base lymph nodes are noted, largest on each side is 8 mm in short axis. There are enlarged left axillary lymph nodes. Largest node measures 14 mm in short axis. Thyroid is unremarkable.  Mediastinum and hila: There is mediastinal and hilar adenopathy. Largest mediastinal node is a precarinal node measuring 19 mm in short axis. Largest right hilar node lies just above the right  pulmonary artery measuring 3.0 x 2.5 cm. Largest left hilar node is 1 cm short axis. There is prevascular node anterior to the left hilum measuring 17 mm in short axis. Heart is normal in size and configuration. There are mild coronary artery calcifications. Great vessels are normal in caliber.  Lungs and pleural: There is a pleural-based left upper lobe posterior lateral mass that measures 6.2 x 3.5 x 5.4 cm. There is adjacent hazy ground-glass type opacity and thickening along the adjacent superior left oblique fissure. There is a well-defined peripheral 8 mm nodule in the right upper lobe, image 25, series 3. There is a 3 mm nodule in the right upper lobe, image 17. No other discrete nodules. Lungs show areas of scarring as well as upper lobe predominant centrilobular emphysema, mild in severity. Additional opacity at the base of the left lower lobe and dependent right lower lobe is likely subsegmental atelectasis. No pleural effusion. No pneumothorax.  Limited upper abdomen: No adrenal masses. Small focal area of enhancement in the posterior segment right lobe of the liver, nonspecific. 5 mm low-density lesion in the anterior segment of the left lobe of  the liver, also nonspecific. Another low-density lesion lies at the dome of the right lobe measuring 5.5 mm. Status post cholecystectomy. Mild chronic dilation of the common bile duct. Low-density left upper pole renal mass consistent with a cyst.  Musculoskeletal: Degenerative anterior osteophytes noted along the thoracic spine. No osteoblastic or osteolytic lesions.  IMPRESSION: 1. Left upper lobe pleural based mass measuring 6.2 cm in greatest dimension. This is concerning for a primary lung malignancy. 2. Bulky mediastinal and hilar adenopathy. Hilar adenopathy is greater on the right than the left, unusual considering that the lung mass is on the left. This may still reflect metastatic adenopathy from lung carcinoma. Lymphoma should also be considered. 3.  Mild neck base adenopathy. There is more significant left axillary adenopathy. 4. 8 mm right upper lobe nodule. 3 mm right upper lobe nodule. These may reflect metastatic disease. 5. Mild emphysema are and areas of lung scarring. Mild basilar subsegmental atelectasis.   Electronically Signed   By: Lajean Manes M.D.   On: 08/22/2015 18:24     ASSESSMENT / PLAN:  PULMONARY A: New large left upper lobe mass very worrisome for bronchogenic carcinoma.  He has mediastinal lymphadenopathy contralateral to the left upper lobe lung mass.  He really needs an EBUS guided biopsy of the mediastinal lymphadenopathy as it is strangely larger on the right than on the left.  The most likely unifying diagnosis would be Stage IIIB lung cancer, but a lymph node biopsy would help sort out if there is an alternative diagnosis (lymphoma?).  Dyspnea> likely due to the lung mass  Emphysema> needs PFT P:   Keep NPO after midnight Will try to make arrangements for bronchoscopy vs TTNA in AM Outpatient PFT  CARDIOVASCULAR A:  Hypertension Dyspnea P:  Nuclear stress test per cardiology ASA per cardiology    Georgann Housekeeper, AGACNP-BC [facility] Pulmonology/Critical Care Pager [phone] or [phone]  08/22/2015 10:54 PM

## 2015-08-22 NOTE — Progress Notes (Signed)
Admission note:  Arrival Method: Patient arrived in w/c to the unit directly from doctor's office. Mental Orientation:  Alert and oriented x 4. Telemetry: 6E-29, CCMD notified.  NSR Assessment: See doc flow sheets. Skin: Warm, dry and intact.  No open areas noted, or any bruises.  Assessed by two nurses Wells Guiles). IV: Right wrist saline locked. Pain: c/o pain below the ribs in the middle, 1/10. Tubes: None. Safety Measures: Bed in low positiohn Fall Prevention Safety Plan: Reviewed the plan,understood and acknowledged. Admission Screening: In progress. 6700 Orientation: Patient has been oriented to the unit, staff and to the room.

## 2015-08-22 NOTE — H&P (Signed)
Referring Physician: Dixie Dials, MD  Jeffrey [name] is an 79 y.o. male.                       Chief Complaint: Shortness of breath x 2 weeks.  HPI: 79 year old male with exertional shortness of breath and cough has abnormal chest x-ray. No h/o hemoptysis. No fever or chills.  Past Medical History  Diagnosis Date  . HTN (hypertension)       Past Surgical History  Procedure Laterality Date  . Cholecystectomy, laparoscopic  2004  . Inguinal hernia repair  1980  . Colonoscopy  2008    normal, no polyps, pt. reported.    Family History  Problem Relation Age of Onset  . Hypertension Sister   . Heart failure Brother   . Hyperlipidemia Daughter   . Asthma Daughter    Social History:  reports that he quit smoking about 16 years ago. His smoking use included Cigarettes. He has a 25.2 pack-year smoking history. He does not have any smokeless tobacco history on file. He reports that he drinks alcohol. He reports that he does not use illicit drugs.  Allergies: No Known Allergies  Facility-administered medications prior to admission  Medication Dose Route Frequency Provider Last Rate Last Dose  . pneumococcal 23 valent vaccine (PNU-IMMUNE) injection 0.5 mL  0.5 mL Intramuscular Once Lyndee Hensen, MD       Medications Prior to Admission  Medication Sig Dispense Refill  . amLODipine-olmesartan (AZOR) 10-40 MG per tablet Take 1 tablet by mouth daily. 30 tablet 3    No results found for this or any previous visit (from the past 48 hour(s)). No results found.  Review Of Systems No weight gain or loss,  Wears bifocal glasses and dentures,  No hemoptysis, No chest pain, No asthma,  No GI bleed. No GU bleed. No hepatitis, blood transfusion, kidney stone,  No stroke, seizures or psychiatric admission.  Blood pressure 130/68, pulse 71, temperature 98 F (36.7 C), temperature source Oral, resp. rate 17, SpO2 94 %. General appearance: appears stated age and in mild distress. Averagely  built and nourished. Head: Normocephalic, without obvious abnormality, atraumatic Eyes: Brown eyes, conjunctivae/corneas clear. PERRL, EOM's intact. Sclera-non-icteric. Neck: No JVD, supple, symmetrical, trachea midline and thyroid not enlarged. Resp: rales LLL and LUL. Chest wall non-tender. Cardio: regular rate and rhythm, S1, S2 normal, no click, rub or gallop GI: soft, non-tender; bowel sounds normal. Extremities: extremities normal, atraumatic, no cyanosis or edema Skin: Skin color, texture, turgor normal. No rashes or lesions Neurologic: Alert and oriented X 3, normal strength and tone. Normal symmetric reflexes. Normal coordination and gait.  Assessment/Plan Shortness of breath on exertion Abnormal chest x-ray Left lung mass  Admit CT chest with contrast. EKG Echo  Birdie Riddle, MD  08/22/2015, 12:53 PM

## 2015-08-22 NOTE — Progress Notes (Signed)
[facility] Progress Note Patient Name: Jeffrey [name] DOB: 1936-10-24 MRN: [id number]   Date of Service  08/22/2015  HPI/Events of Note  Called for consult, lung mass  eICU Interventions  Floor team to see Made NPO after midnight     Intervention Category Minor Interventions: Routine modifications to care plan (e.g. PRN medications for pain, fever)  Enzio Buchler 08/22/2015, 8:45 PM

## 2015-08-23 ENCOUNTER — Inpatient Hospital Stay (HOSPITAL_COMMUNITY): Payer: Medicare Other | Admitting: Certified Registered"

## 2015-08-23 ENCOUNTER — Inpatient Hospital Stay (HOSPITAL_COMMUNITY): Payer: Medicare Other

## 2015-08-23 ENCOUNTER — Encounter (HOSPITAL_COMMUNITY): Payer: Self-pay | Admitting: Certified Registered"

## 2015-08-23 ENCOUNTER — Encounter (HOSPITAL_COMMUNITY)
Admission: AD | Disposition: A | Discharge status: Home / Self Care | Payer: Self-pay | Source: Ambulatory Visit | Attending: Cardiovascular Disease

## 2015-08-23 DIAGNOSIS — R918 Other nonspecific abnormal finding of lung field: Secondary | ICD-10-CM

## 2015-08-23 DIAGNOSIS — R599 Enlarged lymph nodes, unspecified: Secondary | ICD-10-CM

## 2015-08-23 DIAGNOSIS — R590 Localized enlarged lymph nodes: Secondary | ICD-10-CM

## 2015-08-23 HISTORY — PX: VIDEO BRONCHOSCOPY: SHX5072

## 2015-08-23 HISTORY — PX: ENDOBRONCHIAL ULTRASOUND: SHX5096

## 2015-08-23 LAB — BASIC METABOLIC PANEL
Anion gap: 6 (ref 5–15)
BUN: 15 mg/dL (ref 6–20)
CHLORIDE: 103 mmol/L (ref 101–111)
CO2: 26 mmol/L (ref 22–32)
Calcium: 9 mg/dL (ref 8.9–10.3)
Creatinine, Ser: 0.98 mg/dL (ref 0.61–1.24)
GFR calc Af Amer: >60 mL/min (ref >60)
GFR calc non Af Amer: >60 mL/min (ref >60)
GLUCOSE: 99 mg/dL (ref 65–99)
POTASSIUM: 3.8 mmol/L (ref 3.5–5.1)
Sodium: 135 mmol/L (ref 135–145)

## 2015-08-23 LAB — PROTIME-INR
INR: 1.11 (ref 0.00–1.49)
Prothrombin Time: 14.5 seconds (ref 11.6–15.2)

## 2015-08-23 LAB — CBC
HEMATOCRIT: 42.7 % (ref 39.0–52.0)
Hemoglobin: 14.1 g/dL (ref 13.0–17.0)
MCH: 31.1 pg (ref 26.0–34.0)
MCHC: 33 g/dL (ref 30.0–36.0)
MCV: 94.1 fL (ref 78.0–100.0)
Platelets: 193 10*3/uL (ref 150–400)
RBC: 4.54 MIL/uL (ref 4.22–5.81)
RDW: 13.2 % (ref 11.5–15.5)
WBC: 6.3 10*3/uL (ref 4.0–10.5)

## 2015-08-23 LAB — TROPONIN I: Troponin I: <0.03 ng/mL (ref <0.031)

## 2015-08-23 SURGERY — ENDOBRONCHIAL ULTRASOUND (EBUS)
Anesthesia: General | Site: Bronchus

## 2015-08-23 MED ORDER — FENTANYL CITRATE (PF) 250 MCG/5ML IJ SOLN
INTRAMUSCULAR | Status: AC
  Filled 2015-08-23: qty 5

## 2015-08-23 MED ORDER — HYDROMORPHONE HCL 1 MG/ML IJ SOLN: 0.2500 mg | INTRAMUSCULAR | Status: DC | PRN

## 2015-08-23 MED ORDER — PROPOFOL 10 MG/ML IV BOLUS
INTRAVENOUS | Status: DC | PRN
  Administered 2015-08-23: 40 mg via INTRAVENOUS
  Administered 2015-08-23: 160 mg via INTRAVENOUS

## 2015-08-23 MED ORDER — FENTANYL CITRATE (PF) 100 MCG/2ML IJ SOLN: 25.0000 ug | INTRAMUSCULAR | Status: DC | PRN

## 2015-08-23 MED ORDER — MIDAZOLAM HCL 5 MG/5ML IJ SOLN
INTRAMUSCULAR | Status: DC | PRN
  Administered 2015-08-23 (×2): 1 mg via INTRAVENOUS

## 2015-08-23 MED ORDER — LIDOCAINE HCL (CARDIAC) 20 MG/ML IV SOLN
INTRAVENOUS | Status: DC | PRN
  Administered 2015-08-23: 100 mg via INTRAVENOUS
  Administered 2015-08-23: 60 mg via INTRAVENOUS
  Administered 2015-08-23: 100 mg via INTRAVENOUS

## 2015-08-23 MED ORDER — 0.9 % SODIUM CHLORIDE (POUR BTL) OPTIME
TOPICAL | Status: DC | PRN
  Administered 2015-08-23: 1000 mL

## 2015-08-23 MED ORDER — NALOXONE HCL 0.4 MG/ML IJ SOLN
INTRAMUSCULAR | Status: DC | PRN
  Administered 2015-08-23 (×2): 80 ug via INTRAVENOUS

## 2015-08-23 MED ORDER — PROPOFOL 10 MG/ML IV BOLUS
INTRAVENOUS | Status: AC
  Filled 2015-08-23: qty 20

## 2015-08-23 MED ORDER — REGADENOSON 0.4 MG/5ML IV SOLN
0.4000 mg | Freq: Once | INTRAVENOUS | Status: AC
  Administered 2015-08-23: 0.4 mg via INTRAVENOUS
  Filled 2015-08-23: qty 5

## 2015-08-23 MED ORDER — GLYCOPYRROLATE 0.2 MG/ML IJ SOLN
INTRAMUSCULAR | Status: DC | PRN
  Administered 2015-08-23: 0.2 mg via INTRAVENOUS

## 2015-08-23 MED ORDER — ALBUTEROL SULFATE (2.5 MG/3ML) 0.083% IN NEBU
2.5000 mg | INHALATION_SOLUTION | Freq: Two times a day (BID) | RESPIRATORY_TRACT | Status: DC
  Administered 2015-08-23 – 2015-08-24 (×2): 2.5 mg via RESPIRATORY_TRACT
  Filled 2015-08-23 (×3): qty 3

## 2015-08-23 MED ORDER — PHENYLEPHRINE HCL 10 MG/ML IJ SOLN
INTRAMUSCULAR | Status: DC | PRN
  Administered 2015-08-23: 120 ug via INTRAVENOUS
  Administered 2015-08-23 (×2): 80 ug via INTRAVENOUS

## 2015-08-23 MED ORDER — LACTATED RINGERS IV SOLN
INTRAVENOUS | Status: DC
  Administered 2015-08-23 (×2): via INTRAVENOUS

## 2015-08-23 MED ORDER — LIDOCAINE HCL (CARDIAC) 20 MG/ML IV SOLN
INTRAVENOUS | Status: AC
  Filled 2015-08-23: qty 15

## 2015-08-23 MED ORDER — TECHNETIUM TC 99M SESTAMIBI GENERIC - CARDIOLITE
10.0000 | Freq: Once | INTRAVENOUS | Status: AC | PRN
  Administered 2015-08-23: 10 via INTRAVENOUS

## 2015-08-23 MED ORDER — SUCCINYLCHOLINE CHLORIDE 20 MG/ML IJ SOLN
INTRAMUSCULAR | Status: DC | PRN
Start: 2015-08-23 — End: 2015-08-23
  Administered 2015-08-23: 100 mg via INTRAVENOUS

## 2015-08-23 MED ORDER — SUFENTANIL CITRATE 50 MCG/ML IV SOLN
INTRAVENOUS | Status: DC | PRN
  Administered 2015-08-23: 5 ug via INTRAVENOUS
  Administered 2015-08-23: 15 ug via INTRAVENOUS

## 2015-08-23 MED ORDER — EPINEPHRINE HCL 1 MG/ML IJ SOLN
INTRAMUSCULAR | Status: AC
  Filled 2015-08-23: qty 1

## 2015-08-23 MED ORDER — EPINEPHRINE HCL 1 MG/ML IJ SOLN
INTRAMUSCULAR | Status: DC | PRN
  Administered 2015-08-23: 1 mg

## 2015-08-23 MED ORDER — SUFENTANIL CITRATE 50 MCG/ML IV SOLN
INTRAVENOUS | Status: AC
  Filled 2015-08-23: qty 1

## 2015-08-23 MED ORDER — NALOXONE HCL 0.4 MG/ML IJ SOLN
INTRAMUSCULAR | Status: AC
  Filled 2015-08-23: qty 1

## 2015-08-23 MED ORDER — LIDOCAINE HCL (CARDIAC) 20 MG/ML IV SOLN
INTRAVENOUS | Status: AC
  Filled 2015-08-23: qty 5

## 2015-08-23 MED ORDER — PROMETHAZINE HCL 25 MG/ML IJ SOLN: 6.2500 mg | INTRAMUSCULAR | Status: DC | PRN

## 2015-08-23 MED ORDER — ALBUTEROL SULFATE (2.5 MG/3ML) 0.083% IN NEBU: 2.5000 mg | INHALATION_SOLUTION | Freq: Four times a day (QID) | RESPIRATORY_TRACT | Status: DC | PRN

## 2015-08-23 MED ORDER — TECHNETIUM TC 99M SESTAMIBI - CARDIOLITE
30.0000 | Freq: Once | INTRAVENOUS | Status: AC | PRN
  Administered 2015-08-23: 11:00:00 30 via INTRAVENOUS

## 2015-08-23 MED ORDER — PHENYLEPHRINE HCL 10 MG/ML IJ SOLN
10.0000 mg | INTRAVENOUS | Status: DC | PRN
  Administered 2015-08-23: 80 ug/min via INTRAVENOUS

## 2015-08-23 MED ORDER — EPHEDRINE SULFATE 50 MG/ML IJ SOLN
INTRAMUSCULAR | Status: DC | PRN
  Administered 2015-08-23: 10 mg via INTRAVENOUS

## 2015-08-23 MED ORDER — MIDAZOLAM HCL 2 MG/2ML IJ SOLN
INTRAMUSCULAR | Status: AC
  Filled 2015-08-23: qty 4

## 2015-08-23 MED ORDER — REGADENOSON 0.4 MG/5ML IV SOLN
INTRAVENOUS | Status: AC
  Filled 2015-08-23: qty 5

## 2015-08-23 SURGICAL SUPPLY — 30 items
BRUSH CYTOL CELLEBRITY 1.5X140 (MISCELLANEOUS) IMPLANT
CANISTER SUCTION 2500CC (MISCELLANEOUS) ×3 IMPLANT
CONT SPEC 4OZ CLIKSEAL STRL BL (MISCELLANEOUS) ×3 IMPLANT
COVER TABLE BACK 60X90 (DRAPES) ×3 IMPLANT
FORCEPS BIOP RJ4 1.8 (CUTTING FORCEPS) IMPLANT
GAUZE SPONGE 4X4 12PLY STRL (GAUZE/BANDAGES/DRESSINGS) IMPLANT
GLOVE BIOGEL M STRL SZ7.5 (GLOVE) ×3 IMPLANT
GLOVE BIOGEL PI IND STRL 6.5 (GLOVE) IMPLANT
GLOVE BIOGEL PI INDICATOR 6.5 (GLOVE) ×2
GLOVE SURG SS PI 7.0 STRL IVOR (GLOVE) ×2 IMPLANT
GOWN STRL REUS W/ TWL LRG LVL3 (GOWN DISPOSABLE) IMPLANT
GOWN STRL REUS W/ TWL XL LVL3 (GOWN DISPOSABLE) IMPLANT
GOWN STRL REUS W/TWL LRG LVL3 (GOWN DISPOSABLE) ×3
GOWN STRL REUS W/TWL XL LVL3 (GOWN DISPOSABLE) ×3
KIT ROOM TURNOVER OR (KITS) ×3 IMPLANT
MARKER SKIN DUAL TIP RULER LAB (MISCELLANEOUS) ×3 IMPLANT
NDL BIOPSY TRANSBRONCH 21G (NEEDLE) IMPLANT
NEEDLE BIOPSY TRANSBRONCH 21G (NEEDLE) IMPLANT
NEEDLE SONO TIP II EBUS (NEEDLE) ×2 IMPLANT
NS IRRIG 1000ML POUR BTL (IV SOLUTION) ×3 IMPLANT
OIL SILICONE PENTAX (PARTS (SERVICE/REPAIRS)) IMPLANT
PAD ARMBOARD 7.5X6 YLW CONV (MISCELLANEOUS) ×6 IMPLANT
SYR 20CC LL (SYRINGE) ×3 IMPLANT
SYR 20ML ECCENTRIC (SYRINGE) ×6 IMPLANT
SYR 5ML LL (SYRINGE) ×2 IMPLANT
SYR 5ML LUER SLIP (SYRINGE) ×3 IMPLANT
TOWEL OR 17X24 6PK STRL BLUE (TOWEL DISPOSABLE) ×3 IMPLANT
TRAP SPECIMEN MUCOUS 40CC (MISCELLANEOUS) ×3 IMPLANT
TUBE CONNECTING 12'X1/4 (SUCTIONS) ×1
TUBE CONNECTING 12X1/4 (SUCTIONS) ×2 IMPLANT

## 2015-08-23 NOTE — Op Note (Addendum)
   Name:  Jeffrey [name] MRN:  [id number] DOB:  1936/03/22  PROCEDURE NOTE  Procedure(s): Flexible bronchoscopy [phone]) Endobronchial ultrasound (67341) Transbronchial needle aspiration (93790) of the STATION 7 node   Indications:  Hilar / mediastinal lymphadenopathy and Left Upper Lobe lung mass  Consent:  Procedure, benefits, risks and alternatives discussed.  Questions answered.  Consent obtained.  Anesthesia:  General endotracheal.  Procedure summary:  Appropriate equipment was assembled.  The patient was brought to the operating room and identified as [name].  Safety timeout was performed. The patient was placed supine on the operating table, airway established and general anesthesia administered by Anesthesia team.   After the appropriate level of anesthesia was assured, flexible video bronchoscope was lubricated and inserted through the endotracheal tube.   Airway examination was performed bilaterally to subsegmental level.  Minimal clear secretions were noted, mucosa appeared normal and no endobronchial lesions were identified. THE CARINAL REGION APPEARED A BIT FULL but CARINAL ANGLE ITSELF WAS SHARP  Endobronchial ultrasound video bronchoscope was then lubricated and inserted through the endotracheal tube. Surveillance of the mediastinal and and bilateral hilar lymph node stations was performed.  Pathologically enlarged lymph nodes were noted.  Endobronchial ultrasound guided transbronchial needle aspiration of  STATION 7 - 7 x 1  - Under doppler looked like node but pass resulted in emergence of slight heme so aborted. EBUS repositioned again for STATION 7  (passes  X 2) for SLIDE. STATION 7 again (passes x 3) FOR SLIDE and STATION 7 (passes  X 2) for MICRO was performed.  Subsequent to first no bleeding from any of the aspirates.   After this EBUS was used to look for 10L and 10R nodes - none found. At this point as EBUS was retracted patient has strong cough with  outpouring of saliva via mouth. EBUS bronchoscope was withdrawn immediately outside.  At this point call came from pathology and they noted -> PRELIM LIKELY SMALL CELL CARCINOMA   Flexible video bronchoscope was then advanced to review aireway and smattering of blood noticed across all airways. Detailed airway exam x 3 did not reveal any active bleeding. Out of precaution 20cc of epinephrine 1:10,0000 diluted in a 1:200 ratio wa used into each subsegment. Then 37m 1% lidocaine used at level of carina and after waiting 1 minute and ensuring no bleding bronchoscope was withdrawn.   The patient will be transferred to PACU.  Post-procedure chest x-ray was ordered.  Specimens sent: STATION 7 Node for cytology -   Complications:  No immediate complications were noted.  Hemodynamic parameters and oxygenation remained stable throughout the procedure except per CRNA patient needed some neo towards end of procedure due to diprivan. However, by th time he came to PACU extubated he was off neo   Estimated blood loss:  Less then 2 mL.  Final impression  - LIKELY SMALL CELL LUNG CANCER  MISC  - wife not in OR side. Was updated by phone. Later d/w Dr KDoylene Canard Dr. MBrand Males M.D., FGreen[facility] HospitalC.P Pulmonary and Critical Care Medicine Staff Physician [facility] and [facility] Pager: [phone] If no answer or between  15:00h - 7:00h: call [phone]  08/23/2015 5:40 PM

## 2015-08-23 NOTE — Anesthesia Postprocedure Evaluation (Signed)
  Anesthesia Post-op Note  Patient: Jeffrey [name]  Procedure(s) Performed: Procedure(s): ENDOBRONCHIAL ULTRASOUND (N/A) VIDEO BRONCHOSCOPY WITH FLUORO (N/A)  Patient Location: PACU  Anesthesia Type:General  Level of Consciousness: awake  Airway and Oxygen Therapy: Patient Spontanous Breathing  Post-op Pain: mild  Post-op Assessment: Post-op Vital signs reviewed              Post-op Vital Signs: Reviewed  Last Vitals:  Filed Vitals:   08/23/15 1836  BP: 104/58  Pulse: 80  Temp:   Resp: 18    Complications: No apparent anesthesia complications

## 2015-08-23 NOTE — Anesthesia Procedure Notes (Signed)
Procedure Name: Intubation Date/Time: 08/23/2015 4:50 PM Performed by: Melina Copa, Jaisean Monteforte R Pre-anesthesia Checklist: Patient identified, Emergency Drugs available, Suction available, Patient being monitored and Timeout performed Patient Re-evaluated:Patient Re-evaluated prior to inductionOxygen Delivery Method: Circle system utilized Preoxygenation: Pre-oxygenation with 100% oxygen Intubation Type: IV induction Ventilation: Mask ventilation without difficulty Laryngoscope Size: Mac and 4 Grade View: Grade I Tube type: Oral Tube size (mm): 9.5. Number of attempts: 1 Airway Equipment and Method: Stylet Placement Confirmation: ETT inserted through vocal cords under direct vision,  positive ETCO2 and breath sounds checked- equal and bilateral Secured at: 19 cm Tube secured with: Tape Dental Injury: Teeth and Oropharynx as per pre-operative assessment

## 2015-08-23 NOTE — Progress Notes (Addendum)
Came by approx 1h ago to see patient but they said he is in nuclear med stress test. Looking at CT chest ideal is EBUS to get higher stge dx - PCCM next slot for EBUS is on Monday [place] 09/07/15. Got a OR slot for him 08/23/2015 at 13.30 in OR 10 at [facility]. . Patient needs to be seen and consented before procedure. Patient expected back in bed at 11am  Dr. Brand Males, M.D., [facility].C.P Pulmonary and Critical Care Medicine Staff Physician [place] Pulmonary and Critical Care Pager: [phone], If no answer or between  15:00h - 7:00h: call [phone]  08/23/2015 9:05 AM

## 2015-08-23 NOTE — Progress Notes (Signed)
Ref: [name], DO   Subjective:  No reversible ischemia on nuclear stress test with LV EF of 76 %. Preliminary lung biopsy report of small call cancer. Events of bronchoscopy discussed with Dr. Brand Males.   Objective:  Vital Signs in the last 24 hours: Temp:  [97.6 F (36.4 C)-98.6 F (37 C)] 97.6 F (36.4 C) (09/02 1900) Pulse Rate:  [59-88] 72 (09/02 1900) Cardiac Rhythm:  [-] Normal sinus rhythm (09/02 0959) Resp:  [12-28] 17 (09/02 1900) BP: (82-148)/(44-85) 103/52 mmHg (09/02 1900) SpO2:  [93 %-99 %] 95 % (09/02 1900) Weight:  [69.4 kg (153 lb)] 69.4 kg (153 lb) (09/01 2155)  Physical Exam: BP Readings from Last 1 Encounters:  08/23/15 103/52     Wt Readings from Last 1 Encounters:  08/22/15 69.4 kg (153 lb)    Weight change:   HEENT: [place]/AT, Eyes-Brown, PERL, EOMI, Conjunctiva-Pink, Sclera-Non-icteric Neck: No JVD, No bruit, Trachea midline. Lungs:  Rales left lung. Cardiac:  Regular rhythm, normal S1 and S2, no S3.  Abdomen:  Soft, non-tender. Extremities:  No edema present. No cyanosis. No clubbing. CNS: AxOx1 post anesthesia, Cranial nerves grossly intact, moves all 4 extremities. Right handed. Skin: Warm and dry.   Intake/Output from previous day: 09/01 0701 - 09/02 0700 In: 480 [P.O.:480] Out: 0     Lab Results: BMET    Component Value Date/Time   NA 135 08/23/2015 0023   NA 135 08/22/2015 1454   NA 137 09/22/2013 1018   K 3.8 08/23/2015 0023   K 3.8 08/22/2015 1454   K 4.2 09/22/2013 1018   CL 103 08/23/2015 0023   CL 104 08/22/2015 1454   CL 103 09/22/2013 1018   CO2 26 08/23/2015 0023   CO2 26 08/22/2015 1454   CO2 26 09/22/2013 1018   GLUCOSE 99 08/23/2015 0023   GLUCOSE 122* 08/22/2015 1454   GLUCOSE 100* 09/22/2013 1018   BUN 15 08/23/2015 0023   BUN 17 08/22/2015 1454   BUN 18 09/22/2013 1018   CREATININE 0.98 08/23/2015 0023   CREATININE 1.02 08/22/2015 1454   CREATININE 0.94 09/22/2013 1018   CALCIUM 9.0 08/23/2015  0023   CALCIUM 9.0 08/22/2015 1454   CALCIUM 9.8 09/22/2013 1018   GFRNONAA >60 08/23/2015 0023   GFRNONAA >60 08/22/2015 1454   GFRAA >60 08/23/2015 0023   GFRAA >60 08/22/2015 1454   CBC    Component Value Date/Time   WBC 6.3 08/23/2015 0023   RBC 4.54 08/23/2015 0023   HGB 14.1 08/23/2015 0023   HCT 42.7 08/23/2015 0023   PLT 193 08/23/2015 0023   MCV 94.1 08/23/2015 0023   MCH 31.1 08/23/2015 0023   MCHC 33.0 08/23/2015 0023   RDW 13.2 08/23/2015 0023   HEPATIC Function Panel No results for input(s): PROT in the last 8760 hours.  Invalid input(s):  ALBUMIN,  AST,  ALT,  ALKPHOS,  BILIDIR,  IBILI HEMOGLOBIN A1C No components found for: HGA1C,  MPG CARDIAC ENZYMES Lab Results  Component Value Date   TROPONINI <0.03 08/23/2015   TROPONINI <0.03 08/22/2015   TROPONINI <0.03 08/22/2015   BNP No results for input(s): PROBNP in the last 8760 hours. TSH No results for input(s): TSH in the last 8760 hours. CHOLESTEROL No results for input(s): CHOL in the last 8760 hours.  Scheduled Meds: . albuterol  2.5 mg Nebulization BID  . amLODipine  10 mg Oral Daily   And  . irbesartan  300 mg Oral Daily  . heparin  5,000  Units Subcutaneous 3 times per day  . Influenza vac split quadrivalent PF  0.5 mL Intramuscular Tomorrow-1000  . regadenoson      . sodium chloride  3 mL Intravenous Q12H  . sodium chloride  3 mL Intravenous Q12H   Continuous Infusions: . lactated ringers 10 mL/hr at 08/23/15 1323   PRN Meds:.sodium chloride, ondansetron **OR** ondansetron (ZOFRAN) IV, oxyCODONE  Assessment/Plan: Possible small cell lung cancer R/O Metastasis Hypertension  Monitor for additional bleed. CBC in Am CXR in AM. Oncology referral made, they will arrange PET scan and treatment plan post final pathology report. If stable overnight may go home in AM post pulmonary f/u. Dr. Terrence Dupont on call for weekend.     LOS: 1 day    Dixie Dials  MD  08/23/2015, 7:21 PM

## 2015-08-23 NOTE — Anesthesia Preprocedure Evaluation (Addendum)
Anesthesia Evaluation  Patient identified by MRN, date of birth, ID band Patient awake    Reviewed: Allergy & Precautions, NPO status , Patient's Chart, lab work & pertinent test results  Airway Mallampati: I   Neck ROM: Full    Dental   Pulmonary shortness of breath, former smoker,  breath sounds clear to auscultation        Cardiovascular hypertension, Pt. on medications Rhythm:Regular Rate:Normal     Neuro/Psych    GI/Hepatic negative GI ROS, Neg liver ROS,   Endo/Other  negative endocrine ROS  Renal/GU negative Renal ROS     Musculoskeletal   Abdominal   Peds  Hematology   Anesthesia Other Findings   Reproductive/Obstetrics                           Anesthesia Physical Anesthesia Plan  ASA: III  Anesthesia Plan: General   Post-op Pain Management:    Induction: Intravenous  Airway Management Planned: Oral ETT  Additional Equipment:   Intra-op Plan:   Post-operative Plan: Possible Post-op intubation/ventilation  Informed Consent: I have reviewed the patients History and Physical, chart, labs and discussed the procedure including the risks, benefits and alternatives for the proposed anesthesia with the patient or authorized representative who has indicated his/her understanding and acceptance.   Dental advisory given  Plan Discussed with: CRNA, Anesthesiologist and Surgeon  Anesthesia Plan Comments:         Anesthesia Quick Evaluation

## 2015-08-23 NOTE — Transfer of Care (Signed)
Immediate Anesthesia Transfer of Care Note  Patient: Jeffrey [name]  Procedure(s) Performed: Procedure(s): ENDOBRONCHIAL ULTRASOUND (N/A) VIDEO BRONCHOSCOPY WITH FLUORO (N/A)  Patient Location: PACU  Anesthesia Type:General  Level of Consciousness: sedated and patient cooperative  Airway & Oxygen Therapy: Patient Spontanous Breathing and Patient connected to nasal cannula oxygen  Post-op Assessment: Report given to RN, Post -op Vital signs reviewed and stable and Patient moving all extremities  Post vital signs: Reviewed and stable  Last Vitals:  Filed Vitals:   08/23/15 1808  BP: 89/45  Pulse: 88  Temp: 37 C  Resp: 12    Complications: No apparent anesthesia complications

## 2015-08-24 ENCOUNTER — Inpatient Hospital Stay (HOSPITAL_COMMUNITY): Payer: Medicare Other

## 2015-08-24 DIAGNOSIS — R0602 Shortness of breath: Secondary | ICD-10-CM

## 2015-08-24 LAB — BASIC METABOLIC PANEL
ANION GAP: 8 (ref 5–15)
BUN: 17 mg/dL (ref 6–20)
CALCIUM: 8.5 mg/dL — AB (ref 8.9–10.3)
CO2: 24 mmol/L (ref 22–32)
Chloride: 101 mmol/L (ref 101–111)
Creatinine, Ser: 1.11 mg/dL (ref 0.61–1.24)
GFR calc non Af Amer: >60 mL/min (ref >60)
Glucose, Bld: 98 mg/dL (ref 65–99)
Potassium: 4.1 mmol/L (ref 3.5–5.1)
Sodium: 133 mmol/L — ABNORMAL LOW (ref 135–145)

## 2015-08-24 LAB — CBC
HEMATOCRIT: 41.6 % (ref 39.0–52.0)
HEMOGLOBIN: 13.9 g/dL (ref 13.0–17.0)
MCH: 31.8 pg (ref 26.0–34.0)
MCHC: 33.4 g/dL (ref 30.0–36.0)
MCV: 95.2 fL (ref 78.0–100.0)
Platelets: 174 10*3/uL (ref 150–400)
RBC: 4.37 MIL/uL (ref 4.22–5.81)
RDW: 13.2 % (ref 11.5–15.5)
WBC: 7.4 10*3/uL (ref 4.0–10.5)

## 2015-08-24 LAB — MAGNESIUM: MAGNESIUM: 2 mg/dL (ref 1.7–2.4)

## 2015-08-24 LAB — PHOSPHORUS: PHOSPHORUS: 3.8 mg/dL (ref 2.5–4.6)

## 2015-08-24 MED ORDER — PANTOPRAZOLE SODIUM 40 MG PO TBEC: 40.0000 mg | DELAYED_RELEASE_TABLET | Freq: Every day | ORAL | Status: DC

## 2015-08-24 MED ORDER — INFLUENZA VAC SPLIT QUAD 0.5 ML IM SUSY
0.5000 mL | PREFILLED_SYRINGE | Freq: Once | INTRAMUSCULAR | Status: DC
  Filled 2015-08-24: qty 0.5

## 2015-08-24 NOTE — Progress Notes (Signed)
Name: Jeffrey [name] MRN: [id number] DOB: [birth year]    ADMISSION DATE:  08/22/2015  HISTORY OF PRESENT ILLNESS:   79 yoformer smoker with Hilar / mediastinal lymphadenopathy and Left Upper Lobe lung mass. Underwent EBUS bronchoscopy 9/2 with path pending. Some hemoptysis post-bronch.  SUBJECTIVE:   He and nurse report small pink in mucus overnight. No significant blood. Denies chest pain or dyspnea. Eager to get home.  VITAL SIGNS: Temp:  [97.6 F (36.4 C)-98.6 F (37 C)] 98.4 F (36.9 C) (09/03 0422) Pulse Rate:  [59-88] 62 (09/03 0422) Resp:  [12-28] 18 (09/03 0422) BP: (82-148)/(44-85) 109/53 mmHg (09/03 0422) SpO2:  [93 %-99 %] 98 % (09/03 0422)  PHYSICAL EXAMINATION: General: Alert, smiling, sitting on edge of bed. Family helps with [place]. Neuro:  grossly WNL HEENT:  Speech clear, no stridor Cardiovascular:  RRR Lungs:  No rhonchi or wheeze. Bilateral breath sounds, unlabored Abdomen: scaphoid Musculoskeletal:  Moving all extrems Skin:  No visible rash   Recent Labs Lab 08/22/15 1454 08/23/15 0023 08/24/15 0438  NA 135 135 133*  K 3.8 3.8 4.1  CL 104 103 101  CO2 '26 26 24  '$ BUN '17 15 17  '$ CREATININE 1.02 0.98 1.11  GLUCOSE 122* 99 98    Recent Labs Lab 08/22/15 1454 08/23/15 0023 08/24/15 0438  HGB 13.8 14.1 13.9  HCT 41.4 42.7 41.6  WBC 5.4 6.3 7.4  PLT 177 193 174   Ct Chest W Contrast  08/22/2015   CLINICAL DATA:  79 year old male with exertional shortness of breath and cough has abnormal chest x-ray. No h/o hemoptysis. No fever or chills.  EXAM: CT CHEST WITH CONTRAST  TECHNIQUE: Multidetector CT imaging of the chest was performed during intravenous contrast administration.  CONTRAST:  15m OMNIPAQUE IOHEXOL 300 MG/ML  SOLN  COMPARISON:  Chest radiograph, 08/14/2015.  FINDINGS: Thoracic inlet and axilla: Several prominent neck base lymph nodes are noted, largest on each side is 8 mm in short axis. There are enlarged left axillary lymph nodes. Largest  node measures 14 mm in short axis. Thyroid is unremarkable.  Mediastinum and hila: There is mediastinal and hilar adenopathy. Largest mediastinal node is a precarinal node measuring 19 mm in short axis. Largest right hilar node lies just above the right pulmonary artery measuring 3.0 x 2.5 cm. Largest left hilar node is 1 cm short axis. There is prevascular node anterior to the left hilum measuring 17 mm in short axis. Heart is normal in size and configuration. There are mild coronary artery calcifications. Great vessels are normal in caliber.  Lungs and pleural: There is a pleural-based left upper lobe posterior lateral mass that measures 6.2 x 3.5 x 5.4 cm. There is adjacent hazy ground-glass type opacity and thickening along the adjacent superior left oblique fissure. There is a well-defined peripheral 8 mm nodule in the right upper lobe, image 25, series 3. There is a 3 mm nodule in the right upper lobe, image 17. No other discrete nodules. Lungs show areas of scarring as well as upper lobe predominant centrilobular emphysema, mild in severity. Additional opacity at the base of the left lower lobe and dependent right lower lobe is likely subsegmental atelectasis. No pleural effusion. No pneumothorax.  Limited upper abdomen: No adrenal masses. Small focal area of enhancement in the posterior segment right lobe of the liver, nonspecific. 5 mm low-density lesion in the anterior segment of the left lobe of the liver, also nonspecific. Another low-density lesion lies at the dome of  the right lobe measuring 5.5 mm. Status post cholecystectomy. Mild chronic dilation of the common bile duct. Low-density left upper pole renal mass consistent with a cyst.  Musculoskeletal: Degenerative anterior osteophytes noted along the thoracic spine. No osteoblastic or osteolytic lesions.  IMPRESSION: 1. Left upper lobe pleural based mass measuring 6.2 cm in greatest dimension. This is concerning for a primary lung malignancy. 2.  Bulky mediastinal and hilar adenopathy. Hilar adenopathy is greater on the right than the left, unusual considering that the lung mass is on the left. This may still reflect metastatic adenopathy from lung carcinoma. Lymphoma should also be considered. 3. Mild neck base adenopathy. There is more significant left axillary adenopathy. 4. 8 mm right upper lobe nodule. 3 mm right upper lobe nodule. These may reflect metastatic disease. 5. Mild emphysema are and areas of lung scarring. Mild basilar subsegmental atelectasis.   Electronically Signed   By: Lajean Manes M.D.   On: 08/22/2015 18:24   Nm Myocar Multi W/spect W/wall Motion / Ef  08/23/2015   CLINICAL DATA:  79 year old male with shortness of breath on exertion  EXAM: MYOCARDIAL IMAGING WITH SPECT (REST AND PHARMACOLOGIC-STRESS)  GATED LEFT VENTRICULAR WALL MOTION STUDY  LEFT VENTRICULAR EJECTION FRACTION  TECHNIQUE: Standard myocardial SPECT imaging was performed after resting intravenous injection of 10 mCi Tc-62msestamibi. Subsequently, intravenous infusion of Lexiscan was performed under the supervision of the Cardiology staff. At peak effect of the drug, 30 mCi [id number] was injected intravenously and standard myocardial SPECT imaging was performed. Quantitative gated imaging was also performed to evaluate left ventricular wall motion, and estimate left ventricular ejection fraction.  COMPARISON:  CT scan of the chest 08/22/2015  FINDINGS: Perfusion: No decreased activity in the left ventricle on stress imaging to suggest reversible ischemia or infarction. Small region of decreased radiotracer uptake along the inferior and inferolateral wall on both the rest and stress images consistent with diaphragmatic attenuation artifact. There is no associated wall motion abnormality.  Wall Motion: Normal left ventricular wall motion. No left ventricular dilation.  Left Ventricular Ejection Fraction: 76 %  End diastolic volume 61 ml  End systolic volume 14  ml  IMPRESSION: 1. No reversible ischemia or infarction.  2. Normal left ventricular wall motion.  3. Left ventricular ejection fraction 76%  4. Low-risk stress test findings*.  *2012 Appropriate Use Criteria for Coronary Revascularization Focused Update: J Am Coll Cardiol. 205732;20(2):542-706http://content.onairportbarriers.comspx?articleid=1201161   Electronically Signed   By: HeJacqulynn Cadet.D.   On: 08/23/2015 13:14   Dg Chest Port 1 View  08/23/2015   CLINICAL DATA:  Postop lung biopsy.  Shortness of breath.  EXAM: PORTABLE CHEST - 1 VIEW  COMPARISON:  08/14/2015  FINDINGS: There is a left upper lobe lung mass again noted. There is adjacent airspace disease likely reflecting pneumonitis. There is right hilar lymphadenopathy. There is no pleural effusion or pneumothorax. Stable heart size. No acute osseous abnormality.  IMPRESSION: 1. No pneumothorax. 2. Stable left upper lobe pulmonary mass with adjacent airspace disease. Stable right hilar lymphadenopathy.   Electronically Signed   By: HeKathreen Devoid On: 08/23/2015 21:31    ASSESSMENT / PLAN:  Dr RaChase Callerbronchoscopist, described findings as c/w small cell ca.  Dr KaDoylene Canards note indicates plans made for Oncology f\/u.  Patient is stable to go home with family. PCCM can see again prn  Pulmonary and CrIrwinager: ([phone]/02/2015, 8:24 AM

## 2015-08-24 NOTE — Progress Notes (Signed)
Pt with cough and expectorate of quarter size amount of blood tinged sputum (50/50. Page to [phone] for notification with call back from Dr. Chase Caller. New lab orders, continued monitoring and pt to have chest x-ray this am per previous order. Will continue to monitor closely. Dorthey Sawyer, RN

## 2015-08-24 NOTE — Progress Notes (Signed)
08/24/2015 1:43 PM Jeffrey [name] to be D/C'd Home per MD order.  Discussed prescriptions and follow up appointments with the patient. Prescriptions given to patient, medication list explained in detail. Pt verbalized understanding.    Medication List    TAKE these medications        albuterol 108 (90 BASE) MCG/ACT inhaler  Commonly known as:  PROVENTIL HFA;VENTOLIN HFA  Inhale 1-2 puffs into the lungs every 6 (six) hours as needed for wheezing or shortness of breath.     pantoprazole 40 MG tablet  Commonly known as:  PROTONIX  Take 1 tablet (40 mg total) by mouth daily.        Filed Vitals:   08/24/15 0939  BP: 123/55  Pulse: 75  Temp: 98 F (36.7 C)  Resp: 17    Skin clean, dry and intact without evidence of skin break down, no evidence of skin tears noted. IV catheter discontinued intact. Site without signs and symptoms of complications. Pt denies pain at this time. No complaints noted.  An After Visit Summary was printed and given to the patient. Patient D/C home via private auto.  Dola Argyle

## 2015-08-24 NOTE — Discharge Instructions (Signed)
Pulmonary Nodule A pulmonary nodule is a small, round growth of tissue in the lung. Pulmonary nodules can range in size from less than 1/5 inch (4 mm) to a little bigger than an inch (25 mm). Most pulmonary nodules are detected when imaging tests of the lung are being performed for a different problem. Pulmonary nodules are usually not cancerous (benign). However, some pulmonary nodules are cancerous (malignant). Follow-up treatment or testing is based on the size of the pulmonary nodule and your risk of getting lung cancer.  CAUSES Benign pulmonary nodules can be caused by various things. Some of the causes include:   Bacterial, fungal, or viral infections. This is usually an old infection that is no longer active, but it can sometimes be a current, active infection.  A benign mass of tissue.  Inflammation from conditions such as rheumatoid arthritis.   Abnormal blood vessels in the lungs. Malignant pulmonary nodules can result from lung cancer or from cancers that spread to the lung from other places in the body. SIGNS AND SYMPTOMS Pulmonary nodules usually do not cause symptoms. DIAGNOSIS Most often, pulmonary nodules are found incidentally when an X-ray or CT scan is performed to look for some other problem in the lung area. To help determine whether a pulmonary nodule is benign or malignant, your health care provider will take a medical history and order a variety of tests. Tests done may include:   Blood tests.  A skin test called a tuberculin test. This test is used to determine if you have been exposed to the germ that causes tuberculosis.   Chest X-rays. If possible, a new X-ray may be compared with X-rays you have had in the past.   CT scan. This test shows smaller pulmonary nodules more clearly than an X-ray.   Positron emission tomography (PET) scan. In this test, a safe amount of a radioactive substance is injected into the bloodstream. Then, the scan takes a picture of  the pulmonary nodule. The radioactive substance is eliminated from your body in your urine.   Biopsy. A tiny piece of the pulmonary nodule is removed so it can be checked under a microscope. TREATMENT  Pulmonary nodules that are benign normally do not require any treatment because they usually do not cause symptoms or breathing problems. Your health care provider may want to monitor the pulmonary nodule through follow-up CT scans. The frequency of these CT scans will vary based on the size of the nodule and the risk factors for lung cancer. For example, CT scans will need to be done more frequently if the pulmonary nodule is larger and if you have a history of smoking and a family history of cancer. Further testing or biopsies may be done if any follow-up CT scan shows that the size of the pulmonary nodule has increased. HOME CARE INSTRUCTIONS  Only take over-the-counter or prescription medicines as directed by your health care provider.  Keep all follow-up appointments with your health care provider. SEEK MEDICAL CARE IF:  You have trouble breathing when you are active.   You feel sick or unusually tired.   You do not feel like eating.   You lose weight without trying to.   You develop chills or night sweats.  SEEK IMMEDIATE MEDICAL CARE IF:  You cannot catch your breath, or you begin wheezing.   You cannot stop coughing.   You cough up blood.   You become dizzy or feel like you are going to pass out.   You   have sudden chest pain.   You have a fever or persistent symptoms for more than 2-3 days.   You have a fever and your symptoms suddenly get worse. MAKE SURE YOU:  Understand these instructions.  Will watch your condition.  Will get help right away if you are not doing well or get worse. Document Released: 10/04/2009 Document Revised: 08/09/2013 Document Reviewed: 05/29/2013 ExitCare Patient Information 2015 [employer]. This information is not intended  to replace advice given to you by your health care provider. Make sure you discuss any questions you have with your health care provider.  

## 2015-08-24 NOTE — Progress Notes (Signed)
[facility] Progress Note Patient Name: Jeffrey [name] DOB: 1936/12/19 MRN: [id number]   Date of Service  08/24/2015  HPI/Events of Note  RN calling from floor. Patient been doing well. Ate. Went to toilet. Now coughed sputum with a small blood mixed in it. No active bleeding  eICU Interventions  cxr pending Check cbc, bmet, phos, mag     Intervention Category Intermediate Interventions: Other:  Shefali Ng 08/24/2015, 4:32 AM

## 2015-08-24 NOTE — Progress Notes (Signed)
Pt request to sit on the side of the bed after using the restroom. Tolerating well. O2 saturation 97% on RA. Denies severe pain, but notes discomfort when swallowing. No signs of hemoptosis . Pt educated that was most likely due the procedure and to inform me at the first sight of any blood produced by coughing. Will continue to monitor.  Family at the bedside. Dorthey Sawyer, RN

## 2015-08-24 NOTE — Discharge Summary (Signed)
NAMESTELLA, Jeffrey NO.:  [id number]  MEDICAL RECORD NO.:  [id number]  LOCATION:  6E29C                        FACILITY:  [facility]  PHYSICIAN:  Nasra Counce N. Terrence Dupont, M.D. DATE OF BIRTH:  1936-10-31  DATE OF ADMISSION:  08/22/2015 DATE OF DISCHARGE:  08/24/2015                              DISCHARGE SUMMARY   ADMITTING DIAGNOSES: 1. Exertional dyspnea, rule out coronary insufficiency. 2. Abnormal chest x-ray with left lung mass, rule out cancer.  FINAL DIAGNOSES: 1. Status post exertional dyspnea, probably secondary to diastolic     dysfunction. 2. Hypertension controlled by diet. 3. Left lung mass possible small cell cancer of the lung, status post     endobronchial ultrasound/flexible bronchoscopy and transbronchial     needle aspiration. 4. Gastroesophageal reflux disease.  DISCHARGE HOME MEDICATIONS:  Protonix 40 mg 1 tablet daily, albuterol inhaler 1-2 puffs every 6 hours as needed.  DIET:  Low salt, low cholesterol as tolerated.  CONDITION AT DISCHARGE:  Stable.  FOLLOWUP:  Follow up with Dr. Doylene Canard in 1 week.  Oncology referral has been already made. The patient will be scheduled for PET scan and further metastatic workup as outpatient.  Follow up with Dr. Doylene Canard in 1 week.  Follow up with Pulmonary as scheduled.  CONDITION AT DISCHARGE:  Stable.  BRIEF HISTORY AND HOSPITAL COURSE:  [name] is 79 year old [place] male with no significant past medical history except for exertional dyspnea, workup negative in the past.  Had ultrasound which showed mild diastolic dysfunction.  He was admitted on 09/01 because of progressive increasing shortness of breath associated with cough for last 2 weeks. The patient had x-ray done as outpatient which showed hilar and mediastinal adenopathy and left upper lobe mass.  The patient denies any history of hemoptysis.  Denies any fever or chills.  PHYSICAL EXAMINATION:  GENERAL:  He was alert, awake, oriented.   X3. Hemodynamically stable. VITAL SIGNS:  Blood pressure was 130/68, pulse 71.  He was afebrile. Conjunctivae were pink. NECK:  Supple.  No JVD.  He had faint rales in left upper and lower lobe. CARDIOVASCULAR:  S1, S2 was normal.  There was no click murmur or rub. ABDOMEN:  Soft.  Bowel sounds were present.  Nontender. EXTREMITIES:  There is no clubbing, cyanosis, or edema.  LABORATORY DATA:  Sodium was 135, potassium 3.8, chloride 104, bicarb 26, BUN 17, creatinine 1.02.  His calcium was 9.0.  Three sets of troponin I were normal.  BNP was 14.8, hemoglobin was 13.8, hematocrit 41.4.  White count of 5.4, platelet count 177,000, glucose was 122, fasting blood sugar was 98.  Chest x-ray showed no pneumothorax. Stable left upper pulmonary mass with adjacent airspace disease.  Chest x-ray showed no pneumothorax.  Left upper lobe mass, right hilar adenopathy was noted.  The patient also had nuclear stress test which showed no reversible ischemia or infarction, EF of 76%.  The patient also had 2D echo done, which showed grade 1 diastolic dysfunction with good LV systolic function.  BRIEF HOSPITAL COURSE:  The patient was admitted to telemetry unit.  MI was ruled out by serial enzymes and EKG.  The patient subsequently underwent nuclear stress test  as above which showed no evidence of ischemia or infarction with normal LV systolic function.  Pulmonary consultation was obtained.  The patient subsequently underwent endobronchial ultrasound/flexible bronchoscopy and transbronchial needle aspiration.  Preliminary results suggest small cell cancer of the lung. The patient had hemoptysis yesterday.  Postprocedure which has been resolved completely.  The patient has remained afebrile during the hospital stay and is eager to go home.  The patient will be discharged home on above medications.  Will be followed up by Dr. Doylene Canard early next week and with Pulmonary and Oncology as scheduled as  outpatient. Discussed with the patient and family if he develops any difficulty breathing or further hemoptysis.  Should call 911.     Allegra Lai. Terrence Dupont, M.D.     MNH/MEDQ  D:  08/24/2015  T:  08/24/2015  Job:  [id number]  cc:   Birdie Riddle, M.D.

## 2015-08-24 NOTE — Discharge Summary (Signed)
Discharge summary dictated on 08/24/2015 dictation number is [phone]

## 2015-08-27 ENCOUNTER — Encounter (HOSPITAL_COMMUNITY): Payer: Self-pay | Admitting: Internal Medicine

## 2015-08-27 LAB — QUANTIFERON IN TUBE
QFT TB AG MINUS NIL VALUE: 0.16 IU/mL
QUANTIFERON MITOGEN VALUE: 7.32 IU/mL
QUANTIFERON NIL VALUE: 0.06 [IU]/mL
QUANTIFERON TB AG VALUE: 0.22 [IU]/mL
QUANTIFERON TB GOLD: NEGATIVE

## 2015-08-27 LAB — QUANTIFERON TB GOLD ASSAY (BLOOD)

## 2015-08-28 ENCOUNTER — Telehealth: Payer: Self-pay | Admitting: Internal Medicine

## 2015-08-28 NOTE — Telephone Encounter (Signed)
new patient appt-s/w patient dtr Rosa and gave np appt for 09/08 @ 11:15 arrival w/Dr. Julien Nordmann.  Dx-possible small cell lung

## 2015-08-29 ENCOUNTER — Other Ambulatory Visit: Payer: Medicare Other

## 2015-08-29 ENCOUNTER — Ambulatory Visit (HOSPITAL_BASED_OUTPATIENT_CLINIC_OR_DEPARTMENT_OTHER): Payer: Medicare Other | Admitting: Internal Medicine

## 2015-08-29 ENCOUNTER — Other Ambulatory Visit: Payer: Self-pay | Admitting: Medical Oncology

## 2015-08-29 ENCOUNTER — Telehealth: Payer: Self-pay | Admitting: Internal Medicine

## 2015-08-29 ENCOUNTER — Ambulatory Visit: Payer: Medicare Other | Admitting: Oncology

## 2015-08-29 ENCOUNTER — Encounter: Payer: Self-pay | Admitting: Internal Medicine

## 2015-08-29 ENCOUNTER — Other Ambulatory Visit (HOSPITAL_BASED_OUTPATIENT_CLINIC_OR_DEPARTMENT_OTHER): Payer: Medicare Other

## 2015-08-29 VITALS — BP 133/67 | HR 70 | Temp 98.3°F | Resp 18 | Ht 65.0 in | Wt 146.7 lb

## 2015-08-29 DIAGNOSIS — R918 Other nonspecific abnormal finding of lung field: Secondary | ICD-10-CM

## 2015-08-29 DIAGNOSIS — R590 Localized enlarged lymph nodes: Secondary | ICD-10-CM

## 2015-08-29 DIAGNOSIS — R599 Enlarged lymph nodes, unspecified: Secondary | ICD-10-CM

## 2015-08-29 LAB — CBC WITH DIFFERENTIAL/PLATELET
BASO%: 1.5 % (ref 0.0–2.0)
BASOS ABS: 0.1 10*3/uL (ref 0.0–0.1)
EOS ABS: 0.4 10*3/uL (ref 0.0–0.5)
EOS%: 6.7 % (ref 0.0–7.0)
HEMATOCRIT: 43.8 % (ref 38.4–49.9)
HEMOGLOBIN: 14.6 g/dL (ref 13.0–17.1)
LYMPH#: 1.5 10*3/uL (ref 0.9–3.3)
LYMPH%: 21.8 % (ref 14.0–49.0)
MCH: 31.2 pg (ref 27.2–33.4)
MCHC: 33.4 g/dL (ref 32.0–36.0)
MCV: 93.4 fL (ref 79.3–98.0)
MONO#: 0.6 10*3/uL (ref 0.1–0.9)
MONO%: 9.1 % (ref 0.0–14.0)
NEUT#: 4.1 10*3/uL (ref 1.5–6.5)
NEUT%: 60.9 % (ref 39.0–75.0)
Platelets: 227 10*3/uL (ref 140–400)
RBC: 4.69 10*6/uL (ref 4.20–5.82)
RDW: 13.6 % (ref 11.0–14.6)
WBC: 6.7 10*3/uL (ref 4.0–10.3)

## 2015-08-29 LAB — COMPREHENSIVE METABOLIC PANEL (CC13)
ALBUMIN: 3.9 g/dL (ref 3.5–5.0)
ALK PHOS: 63 U/L (ref 40–150)
ALT: 25 U/L (ref 0–55)
ANION GAP: 8 meq/L (ref 3–11)
AST: 31 U/L (ref 5–34)
BUN: 14.7 mg/dL (ref 7.0–26.0)
CALCIUM: 9.7 mg/dL (ref 8.4–10.4)
CHLORIDE: 101 meq/L (ref 98–109)
CO2: 26 mEq/L (ref 22–29)
Creatinine: 1.1 mg/dL (ref 0.7–1.3)
EGFR: 66 mL/min/{1.73_m2} — AB (ref >90)
Glucose: 96 mg/dl (ref 70–140)
POTASSIUM: 4.5 meq/L (ref 3.5–5.1)
Sodium: 135 mEq/L — ABNORMAL LOW (ref 136–145)
Total Bilirubin: 0.95 mg/dL (ref 0.20–1.20)
Total Protein: 8.8 g/dL — ABNORMAL HIGH (ref 6.4–8.3)

## 2015-08-29 NOTE — Progress Notes (Signed)
[facility] [phone]   [phone]  CONSULT NOTE  REFERRING PHYSICIAN: Dr. Brand Males  REASON FOR CONSULTATION:  79 years old Asian male recently diagnosed with lung cancer.  HPI Jeffrey [name] is a 79 y.o. male with past medical history significant for hypertension and remote history of seizure secondary to head injury last one was in 1999. The patient also has a history of smoking but quit in 1999. He was seen by his primary care physician Dr. Doylene Canard complaining of epigastric pain as well as central chest pain. He ordered chest x-ray which was performed on 08/14/2015 and it showed large left upper lobe focal opacity likely representing a mass measuring 6.3 x 3.9 x 4.9 cm. This was followed by CT scan of the chest with contrast on 08/22/2015 and it showed a pleural-based left upper lobe posterior lateral mass that measures 6.2 x 3.5 x 5.4 cm concerning for primary lung malignancy. There is adjacent hazy ground-glass type opacity and thickening along the adjacent superior left oblique fissure.There was also bulky mediastinal and hilar adenopathy. The hilar adenopathy with greater on the right than the left. Mild neck base adenopathy. There is more significant left axillary adenopathy. 4. 8 mm right upper lobe nodule. 3 mm right upper lobe nodule. These may reflect metastatic disease. The patient was referred to Dr. Chase Caller and on 08/23/2015 he underwent flexible bronchoscopy, endobronchial ultrasound and transbronchial needle aspiration of the station 7 node.  The final cytology from the fine needle aspiration of the level 7 node (Accession: [id number]) showed malignant cells. The malignant cells are consistent with non-small cell carcinoma and squamous cell carcinoma is favored. The patient was referred to me today for further evaluation and recommendation regarding treatment of his condition. When seen today he is feeling fine with no specific  complaints except for mild cough and shortness breath at baseline and increased with exertion. He also lost few pounds recently secondary to changing his diet. He denied having any significant night sweats. The patient denied having any chest pain or hemoptysis. He denied having any nausea, vomiting or change in his bowel movement. He denied having any significant headache or visual changes. Family history significant for mother who died in her 44Y with complication of appendicitis. His father died from old age. The patient is married and has 7 children. He is to work as an Clinical biochemist at [employer]. He has a history of smoking 1 pack per day for around 20 years but quit in 1999. He drinks alcohol occasionally and no history of drug abuse. His daughter Kalman Shan used to work at the [employer].  HPI  Past Medical History  Diagnosis Date  . HTN (hypertension)   . Complication of anesthesia     " anesthesia makes me dizzy "  . Shortness of breath dyspnea   . Arthritis     " in my fingers"    Past Surgical History  Procedure Laterality Date  . Cholecystectomy, laparoscopic  2004  . Inguinal hernia repair  1980  . Colonoscopy  2008    normal, no polyps, pt. reported.  . Cholecystectomy    . Endobronchial ultrasound N/A 08/23/2015    Procedure: ENDOBRONCHIAL ULTRASOUND;  Surgeon: Brand Males, MD;  Location: [facility];  Service: Cardiopulmonary;  Laterality: N/A;  . Video bronchoscopy N/A 08/23/2015    Procedure: VIDEO BRONCHOSCOPY WITH FLUORO;  Surgeon: Brand Males, MD;  Location: [facility];  Service: Cardiopulmonary;  Laterality: N/A;  Family History  Problem Relation Age of Onset  . Hypertension Sister   . Heart failure Brother   . Hyperlipidemia Daughter   . Asthma Daughter     Social History Social History  Substance Use Topics  . Smoking status: Former Smoker -- 0.60 packs/day for 42 years    Types: Cigarettes    Quit date: 02/19/2015  . Smokeless tobacco: Never Used    . Alcohol Use: Yes     Comment: occassional     No Known Allergies  Current Outpatient Prescriptions  Medication Sig Dispense Refill  . albuterol (PROVENTIL HFA;VENTOLIN HFA) 108 (90 BASE) MCG/ACT inhaler Inhale 1-2 puffs into the lungs every 6 (six) hours as needed for wheezing or shortness of breath.     No current facility-administered medications for this visit.    Review of Systems  Constitutional: negative Eyes: negative Ears, nose, mouth, throat, and face: negative Respiratory: positive for cough and dyspnea on exertion Cardiovascular: negative Gastrointestinal: negative Genitourinary:negative Integument/breast: negative Hematologic/lymphatic: negative Musculoskeletal:negative Neurological: negative Behavioral/Psych: negative Endocrine: negative Allergic/Immunologic: negative  Physical Exam  FFM:BWGYK, healthy, no distress, well nourished and well developed SKIN: skin color, texture, turgor are normal, no rashes or significant lesions HEAD: Normocephalic, No masses, lesions, tenderness or abnormalities EYES: normal, PERRLA, Conjunctiva are pink and non-injected EARS: External ears normal, Canals clear OROPHARYNX:no exudate, no erythema and lips, buccal mucosa, and tongue normal  NECK: supple, no adenopathy, no JVD LYMPH:  no palpable lymphadenopathy, no hepatosplenomegaly LUNGS: clear to auscultation , and palpation HEART: regular rate & rhythm, no murmurs and no gallops ABDOMEN:abdomen soft, non-tender, normal bowel sounds and no masses or organomegaly BACK: Back symmetric, no curvature., No CVA tenderness EXTREMITIES:no joint deformities, effusion, or inflammation, no edema, no skin discoloration  NEURO: alert & oriented x 3 with fluent speech, no focal motor/sensory deficits  PERFORMANCE STATUS: ECOG 1  LABORATORY DATA: Lab Results  Component Value Date   WBC 6.7 08/29/2015   HGB 14.6 08/29/2015   HCT 43.8 08/29/2015   MCV 93.4 08/29/2015   PLT 227  08/29/2015      Chemistry      Component Value Date/Time   NA 135* 08/29/2015 1137   NA 133* 08/24/2015 0438   K 4.5 08/29/2015 1137   K 4.1 08/24/2015 0438   CL 101 08/24/2015 0438   CO2 26 08/29/2015 1137   CO2 24 08/24/2015 0438   BUN 14.7 08/29/2015 1137   BUN 17 08/24/2015 0438   CREATININE 1.1 08/29/2015 1137   CREATININE 1.11 08/24/2015 0438   CREATININE 0.94 09/22/2013 1018      Component Value Date/Time   CALCIUM 9.7 08/29/2015 1137   CALCIUM 8.5* 08/24/2015 0438   ALKPHOS 63 08/29/2015 1137   AST 31 08/29/2015 1137   ALT 25 08/29/2015 1137   BILITOT 0.95 08/29/2015 1137       RADIOGRAPHIC STUDIES: Dg Chest 2 View  08/14/2015   CLINICAL DATA:  Shortness of breath on exertion for 1 month, hypertension, question pneumonia, former smoker  EXAM: CHEST  2 VIEW  COMPARISON:  None  FINDINGS: Normal heart size.  Atherosclerotic calcification aorta.  Enlargement of RIGHT hilum.  Large LEFT upper lobe focal opacity likely representing mass, 6.3 x 3.9 x 4.9 cm.  Question minimal coexistent infiltrate in LEFT upper lobe as well.  Underlying emphysematous changes.  No pleural effusion or pneumothorax.  IMPRESSION: LEFT upper lobe mass-like opacity highly concerning for pulmonary neoplasm.  Enlargement of RIGHT hilum as well concerning for adenopathy.  Further evaluation by CT chest with contrast recommended.  Findings called to Dr. Doylene Canard On 08/14/2015 at 1513 hours.   Electronically Signed   By: Lavonia Dana M.D.   On: 08/14/2015 15:15   Ct Chest W Contrast  08/22/2015   CLINICAL DATA:  79 year old male with exertional shortness of breath and cough has abnormal chest x-ray. No h/o hemoptysis. No fever or chills.  EXAM: CT CHEST WITH CONTRAST  TECHNIQUE: Multidetector CT imaging of the chest was performed during intravenous contrast administration.  CONTRAST:  120m OMNIPAQUE IOHEXOL 300 MG/ML  SOLN  COMPARISON:  Chest radiograph, 08/14/2015.  FINDINGS: Thoracic inlet and axilla:  Several prominent neck base lymph nodes are noted, largest on each side is 8 mm in short axis. There are enlarged left axillary lymph nodes. Largest node measures 14 mm in short axis. Thyroid is unremarkable.  Mediastinum and hila: There is mediastinal and hilar adenopathy. Largest mediastinal node is a precarinal node measuring 19 mm in short axis. Largest right hilar node lies just above the right pulmonary artery measuring 3.0 x 2.5 cm. Largest left hilar node is 1 cm short axis. There is prevascular node anterior to the left hilum measuring 17 mm in short axis. Heart is normal in size and configuration. There are mild coronary artery calcifications. Great vessels are normal in caliber.  Lungs and pleural: There is a pleural-based left upper lobe posterior lateral mass that measures 6.2 x 3.5 x 5.4 cm. There is adjacent hazy ground-glass type opacity and thickening along the adjacent superior left oblique fissure. There is a well-defined peripheral 8 mm nodule in the right upper lobe, image 25, series 3. There is a 3 mm nodule in the right upper lobe, image 17. No other discrete nodules. Lungs show areas of scarring as well as upper lobe predominant centrilobular emphysema, mild in severity. Additional opacity at the base of the left lower lobe and dependent right lower lobe is likely subsegmental atelectasis. No pleural effusion. No pneumothorax.  Limited upper abdomen: No adrenal masses. Small focal area of enhancement in the posterior segment right lobe of the liver, nonspecific. 5 mm low-density lesion in the anterior segment of the left lobe of the liver, also nonspecific. Another low-density lesion lies at the dome of the right lobe measuring 5.5 mm. Status post cholecystectomy. Mild chronic dilation of the common bile duct. Low-density left upper pole renal mass consistent with a cyst.  Musculoskeletal: Degenerative anterior osteophytes noted along the thoracic spine. No osteoblastic or osteolytic lesions.   IMPRESSION: 1. Left upper lobe pleural based mass measuring 6.2 cm in greatest dimension. This is concerning for a primary lung malignancy. 2. Bulky mediastinal and hilar adenopathy. Hilar adenopathy is greater on the right than the left, unusual considering that the lung mass is on the left. This may still reflect metastatic adenopathy from lung carcinoma. Lymphoma should also be considered. 3. Mild neck base adenopathy. There is more significant left axillary adenopathy. 4. 8 mm right upper lobe nodule. 3 mm right upper lobe nodule. These may reflect metastatic disease. 5. Mild emphysema are and areas of lung scarring. Mild basilar subsegmental atelectasis.   Electronically Signed   By: DLajean ManesM.D.   On: 08/22/2015 18:24   Nm Myocar Multi W/spect W/wall Motion / Ef  08/23/2015   CLINICAL DATA:  79year old male with shortness of breath on exertion  EXAM: MYOCARDIAL IMAGING WITH SPECT (REST AND PHARMACOLOGIC-STRESS)  GATED LEFT VENTRICULAR WALL MOTION STUDY  LEFT VENTRICULAR EJECTION  FRACTION  TECHNIQUE: Standard myocardial SPECT imaging was performed after resting intravenous injection of 10 mCi Tc-4msestamibi. Subsequently, intravenous infusion of Lexiscan was performed under the supervision of the Cardiology staff. At peak effect of the drug, 30 mCi Tc-950mestamibi was injected intravenously and standard myocardial SPECT imaging was performed. Quantitative gated imaging was also performed to evaluate left ventricular wall motion, and estimate left ventricular ejection fraction.  COMPARISON:  CT scan of the chest 08/22/2015  FINDINGS: Perfusion: No decreased activity in the left ventricle on stress imaging to suggest reversible ischemia or infarction. Small region of decreased radiotracer uptake along the inferior and inferolateral wall on both the rest and stress images consistent with diaphragmatic attenuation artifact. There is no associated wall motion abnormality.  Wall Motion: Normal left  ventricular wall motion. No left ventricular dilation.  Left Ventricular Ejection Fraction: 76 %  End diastolic volume 61 ml  End systolic volume 14 ml  IMPRESSION: 1. No reversible ischemia or infarction.  2. Normal left ventricular wall motion.  3. Left ventricular ejection fraction 76%  4. Low-risk stress test findings*.  *2012 Appropriate Use Criteria for Coronary Revascularization Focused Update: J Am Coll Cardiol. 201610;96(0):[id number]://[email or web address]?articleid=[email or web address]   Electronically Signed   By: HeJacqulynn Cadet.D.   On: 08/23/2015 13:14   Dg Chest Port 1 View  08/24/2015   CLINICAL DATA:  Hypertension, short of breath  EXAM: PORTABLE CHEST - 1 VIEW  COMPARISON:  Radiograph 08/23/2015  FINDINGS: LEFT upper lobe mass again demonstrated. There is atelectasis in the LEFT lower lobe unchanged. RIGHT hilar adenopathy noted. No pneumothorax.  IMPRESSION: 1. No interval change.  No pneumothorax. 2. LEFT upper lobe mass and RIGHT hilar adenopathy again demonstrated.   Electronically Signed   By: StSuzy Bouchard.D.   On: 08/24/2015 08:33   Dg Chest Port 1 View  08/23/2015   CLINICAL DATA:  Postop lung biopsy.  Shortness of breath.  EXAM: PORTABLE CHEST - 1 VIEW  COMPARISON:  08/14/2015  FINDINGS: There is a left upper lobe lung mass again noted. There is adjacent airspace disease likely reflecting pneumonitis. There is right hilar lymphadenopathy. There is no pleural effusion or pneumothorax. Stable heart size. No acute osseous abnormality.  IMPRESSION: 1. No pneumothorax. 2. Stable left upper lobe pulmonary mass with adjacent airspace disease. Stable right hilar lymphadenopathy.   Electronically Signed   By: HeKathreen Devoid On: 08/23/2015 21:31    ASSESSMENT: This is a very pleasant [age]ears old Asian male diagnosed in September 2016 with stage IIIB/IV (T2b, N3, M1a) non-small cell lung cancer, favoring squamous cell carcinoma, presented with large pleural-based left upper lobe  mass in addition to bilateral hilar and mediastinal lymphadenopathy and questionable right lung pulmonary nodules.   PLAN: I had a lengthy discussion with the patient and his family today about his current disease stage, prognosis and treatment options. I recommended for the patient to complete the staging workup by ordering a PET scan as well as MRI of the brain. His pathology was not final at the time of the discussion but preliminary discussion with the pathologist was concerning for squamous cell carcinoma. I would consider sending his tissue insufficient for PDL 1 testing for consideration of immunotherapy in the future. I discussed with the patient briefly his treatment options including systemic chemotherapy versus palliative care. I will bring the patient back for follow-up visit in one week for more detailed discussion of his systemic treatment options based on the final staging workup.  I gave the patient and his family the time to ask questions and I answered them completely to their satisfaction. He was advised to call immediately if he has any concerning symptoms in the interval.  The patient voices understanding of current disease status and treatment options and is in agreement with the current care plan.  All questions were answered. The patient knows to call the clinic with any problems, questions or concerns. We can certainly see the patient much sooner if necessary.  Thank you so much for allowing me to participate in the care of Jeffrey [name]. I will continue to follow up the patient with you and assist in his care.  I spent 40 minutes counseling the patient face to face. The total time spent in the appointment was 60 minutes.  Disclaimer: This note was dictated with voice recognition software. Similar sounding words can inadvertently be transcribed and may not be corrected upon review.   Mylissa Lambe K. August 29, 2015, 12:57 PM

## 2015-08-29 NOTE — Telephone Encounter (Signed)
S.w. Pt and advised on Sept appt...ok and aware

## 2015-08-29 NOTE — Progress Notes (Signed)
Quick Note:  LVM for pt/family to return call. ______

## 2015-08-30 ENCOUNTER — Telehealth: Payer: Self-pay | Admitting: Internal Medicine

## 2015-08-30 SURGERY — ENDOBRONCHIAL ULTRASOUND (EBUS)
Anesthesia: General

## 2015-08-30 NOTE — Telephone Encounter (Signed)
cld pt daughter rose to adv of [facility] #-had them on phone only eevning hours available. Adv per Vaughan Basta NPR on PET or MRI-Rose stated she will call to have sch-adv MD appt sch for 9/16-Rose understood

## 2015-09-02 ENCOUNTER — Ambulatory Visit (HOSPITAL_COMMUNITY): Admission: RE | Admit: 2015-09-02 | Payer: Medicare Other | Source: Ambulatory Visit

## 2015-09-03 ENCOUNTER — Other Ambulatory Visit: Payer: Self-pay | Admitting: Radiology

## 2015-09-03 ENCOUNTER — Encounter: Payer: Self-pay | Admitting: Internal Medicine

## 2015-09-03 NOTE — Progress Notes (Signed)
Contacted pt to introduce myself. There was a little language barrier. Pt states he does not have any questions but I will give him my card at his next appointment.

## 2015-09-04 ENCOUNTER — Ambulatory Visit
Admission: RE | Admit: 2015-09-04 | Discharge: 2015-09-04 | Disposition: A | Discharge status: Home / Self Care | Payer: Medicare Other | Source: Ambulatory Visit | Attending: Internal Medicine | Admitting: Internal Medicine

## 2015-09-04 ENCOUNTER — Telehealth: Payer: Self-pay | Admitting: *Deleted

## 2015-09-04 DIAGNOSIS — Z79899 Other long term (current) drug therapy: Secondary | ICD-10-CM | POA: Insufficient documentation

## 2015-09-04 DIAGNOSIS — R590 Localized enlarged lymph nodes: Secondary | ICD-10-CM

## 2015-09-04 DIAGNOSIS — R599 Enlarged lymph nodes, unspecified: Secondary | ICD-10-CM | POA: Diagnosis present

## 2015-09-04 DIAGNOSIS — R918 Other nonspecific abnormal finding of lung field: Secondary | ICD-10-CM | POA: Insufficient documentation

## 2015-09-04 LAB — GLUCOSE, CAPILLARY: GLUCOSE-CAPILLARY: 89 mg/dL (ref 65–99)

## 2015-09-04 MED ORDER — FLUDEOXYGLUCOSE F - 18 (FDG) INJECTION
12.3700 | Freq: Once | INTRAVENOUS | Status: DC | PRN
  Administered 2015-09-04: 12.37 via INTRAVENOUS
  Filled 2015-09-04: qty 12.37

## 2015-09-04 NOTE — Telephone Encounter (Signed)
Oncology Nurse Navigator Documentation  Oncology Nurse Navigator Flowsheets 09/04/2015  Navigator Encounter Type Telephone/called daughter to follow up.  I left vm message regarding appt reminders.  I also left me name and phone number to call if needed.   Treatment Phase Abnormal Scans  Time Spent with Patient 15

## 2015-09-05 ENCOUNTER — Ambulatory Visit (HOSPITAL_COMMUNITY)
Admission: RE | Admit: 2015-09-05 | Discharge: 2015-09-05 | Disposition: A | Discharge status: Home / Self Care | Payer: Medicare Other | Source: Ambulatory Visit | Attending: Internal Medicine | Admitting: Internal Medicine

## 2015-09-05 ENCOUNTER — Inpatient Hospital Stay (HOSPITAL_COMMUNITY): Admission: RE | Admit: 2015-09-05 | Payer: Medicare Other | Source: Ambulatory Visit

## 2015-09-05 ENCOUNTER — Ambulatory Visit (HOSPITAL_COMMUNITY): Admission: RE | Admit: 2015-09-05 | Payer: Medicare Other | Source: Ambulatory Visit

## 2015-09-05 DIAGNOSIS — C3490 Malignant neoplasm of unspecified part of unspecified bronchus or lung: Secondary | ICD-10-CM | POA: Diagnosis present

## 2015-09-05 DIAGNOSIS — R569 Unspecified convulsions: Secondary | ICD-10-CM | POA: Diagnosis not present

## 2015-09-05 DIAGNOSIS — G319 Degenerative disease of nervous system, unspecified: Secondary | ICD-10-CM | POA: Insufficient documentation

## 2015-09-05 DIAGNOSIS — I739 Peripheral vascular disease, unspecified: Secondary | ICD-10-CM | POA: Insufficient documentation

## 2015-09-05 DIAGNOSIS — M479 Spondylosis, unspecified: Secondary | ICD-10-CM | POA: Diagnosis not present

## 2015-09-05 DIAGNOSIS — R918 Other nonspecific abnormal finding of lung field: Secondary | ICD-10-CM

## 2015-09-05 DIAGNOSIS — R590 Localized enlarged lymph nodes: Secondary | ICD-10-CM

## 2015-09-06 ENCOUNTER — Encounter: Payer: Self-pay | Admitting: *Deleted

## 2015-09-06 ENCOUNTER — Other Ambulatory Visit (HOSPITAL_BASED_OUTPATIENT_CLINIC_OR_DEPARTMENT_OTHER): Payer: Medicare Other

## 2015-09-06 ENCOUNTER — Ambulatory Visit (HOSPITAL_BASED_OUTPATIENT_CLINIC_OR_DEPARTMENT_OTHER): Payer: Medicare Other | Admitting: Internal Medicine

## 2015-09-06 VITALS — BP 166/69 | HR 70 | Temp 98.0°F | Resp 18 | Ht 64.0 in | Wt 143.2 lb

## 2015-09-06 DIAGNOSIS — C3492 Malignant neoplasm of unspecified part of left bronchus or lung: Secondary | ICD-10-CM

## 2015-09-06 DIAGNOSIS — R590 Localized enlarged lymph nodes: Secondary | ICD-10-CM

## 2015-09-06 DIAGNOSIS — R918 Other nonspecific abnormal finding of lung field: Secondary | ICD-10-CM

## 2015-09-06 LAB — COMPREHENSIVE METABOLIC PANEL (CC13)
ALBUMIN: 3.8 g/dL (ref 3.5–5.0)
ALT: 17 U/L (ref 0–55)
AST: 28 U/L (ref 5–34)
Alkaline Phosphatase: 63 U/L (ref 40–150)
Anion Gap: 8 mEq/L (ref 3–11)
BUN: 13.6 mg/dL (ref 7.0–26.0)
CHLORIDE: 102 meq/L (ref 98–109)
CO2: 28 mEq/L (ref 22–29)
CREATININE: 1 mg/dL (ref 0.7–1.3)
Calcium: 10.2 mg/dL (ref 8.4–10.4)
EGFR: 71 mL/min/{1.73_m2} — ABNORMAL LOW (ref >90)
GLUCOSE: 94 mg/dL (ref 70–140)
POTASSIUM: 5.3 meq/L — AB (ref 3.5–5.1)
SODIUM: 138 meq/L (ref 136–145)
Total Bilirubin: 0.7 mg/dL (ref 0.20–1.20)
Total Protein: 8.9 g/dL — ABNORMAL HIGH (ref 6.4–8.3)

## 2015-09-06 LAB — CBC WITH DIFFERENTIAL/PLATELET
BASO%: 0.8 % (ref 0.0–2.0)
BASOS ABS: 0.1 10*3/uL (ref 0.0–0.1)
EOS%: 5 % (ref 0.0–7.0)
Eosinophils Absolute: 0.3 10*3/uL (ref 0.0–0.5)
HCT: 44.4 % (ref 38.4–49.9)
HEMOGLOBIN: 14.8 g/dL (ref 13.0–17.1)
LYMPH#: 1.6 10*3/uL (ref 0.9–3.3)
LYMPH%: 23.7 % (ref 14.0–49.0)
MCH: 31.4 pg (ref 27.2–33.4)
MCHC: 33.4 g/dL (ref 32.0–36.0)
MCV: 94 fL (ref 79.3–98.0)
MONO#: 0.6 10*3/uL (ref 0.1–0.9)
MONO%: 9.3 % (ref 0.0–14.0)
NEUT#: 4 10*3/uL (ref 1.5–6.5)
NEUT%: 61.2 % (ref 39.0–75.0)
Platelets: 205 10*3/uL (ref 140–400)
RBC: 4.72 10*6/uL (ref 4.20–5.82)
RDW: 13.6 % (ref 11.0–14.6)
WBC: 6.6 10*3/uL (ref 4.0–10.3)

## 2015-09-06 NOTE — Progress Notes (Signed)
[facility] [phone]   [phone]  OFFICE PROGRESS NOTE  Jeffrey Doss, DO [place] [address]  DIAGNOSIS:  Stage IV (T2b, N3, M Ib) non-small cell lung cancer, squamous cell carcinoma presented with large left upper lobe lung mass in addition to bilateral mediastinal and hilar lymphadenopathy as well as left axillary, bilateral lower internal jugular and left supraclavicular lymphadenopathy diagnosed in September 2016  PRIOR THERAPY: None  CURRENT THERAPY: None  INTERVAL HISTORY: Jeffrey [name] 79 y.o. male returns to the clinic today for follow-up visit accompanied by several family members including his wife, 2 daughters, son and granddaughter. The patient was recently diagnosed with metastatic non-small cell lung cancer, squamous cell carcinoma. He had several studies since his last visit including a PET scan as well as MRI of the brain. He is here for evaluation and discussion of his scan results and treatment options. The patient is feeling fine except for the shortness of breath and cough. He denied having any significant chest pain or hemoptysis. He denied having any significant weight loss or night sweats. He has no nausea or vomiting, no fever or chills.  MEDICAL HISTORY: Past Medical History  Diagnosis Date  . HTN (hypertension)   . Complication of anesthesia     " anesthesia makes me dizzy "  . Shortness of breath dyspnea   . Arthritis     " in my fingers"    ALLERGIES:  has No Known Allergies.  MEDICATIONS:  Current Outpatient Prescriptions  Medication Sig Dispense Refill  . albuterol (PROVENTIL HFA;VENTOLIN HFA) 108 (90 BASE) MCG/ACT inhaler Inhale 1-2 puffs into the lungs every 6 (six) hours as needed for wheezing or shortness of breath.     No current facility-administered medications for this visit.   Facility-Administered Medications Ordered in Other Visits  Medication Dose Route Frequency  Provider Last Rate Last Dose  . fludeoxyglucose F - 18 (FDG) injection 12.37 milli Curie  12.37 milli Curie Intravenous Once PRN Curt Bears, MD   12.37 milli Curie at 09/04/15 1141    SURGICAL HISTORY:  Past Surgical History  Procedure Laterality Date  . Cholecystectomy, laparoscopic  2004  . Inguinal hernia repair  1980  . Colonoscopy  2008    normal, no polyps, pt. reported.  . Cholecystectomy    . Endobronchial ultrasound N/A 08/23/2015    Procedure: ENDOBRONCHIAL ULTRASOUND;  Surgeon: Brand Males, MD;  Location: [facility];  Service: Cardiopulmonary;  Laterality: N/A;  . Video bronchoscopy N/A 08/23/2015    Procedure: VIDEO BRONCHOSCOPY WITH FLUORO;  Surgeon: Brand Males, MD;  Location: [facility];  Service: Cardiopulmonary;  Laterality: N/A;    REVIEW OF SYSTEMS:  Constitutional: positive for fatigue Eyes: negative Ears, nose, mouth, throat, and face: negative Respiratory: positive for cough and dyspnea on exertion Cardiovascular: negative Gastrointestinal: negative Genitourinary:negative Integument/breast: negative Hematologic/lymphatic: negative Musculoskeletal:negative Neurological: negative Behavioral/Psych: negative Endocrine: negative Allergic/Immunologic: negative   PHYSICAL EXAMINATION: General appearance: alert, cooperative, fatigued and no distress Head: Normocephalic, without obvious abnormality, atraumatic Neck: no adenopathy, no JVD, supple, symmetrical, trachea midline and thyroid not enlarged, symmetric, no tenderness/mass/nodules Lymph nodes: Cervical, supraclavicular, and axillary nodes normal. Resp: clear to auscultation bilaterally Back: symmetric, no curvature. ROM normal. No CVA tenderness. Cardio: regular rate and rhythm, S1, S2 normal, no murmur, click, rub or gallop GI: soft, non-tender; bowel sounds normal; no masses,  no organomegaly Extremities: extremities normal, atraumatic, no cyanosis or edema Neurologic: Alert and oriented X 3,  normal  strength and tone. Normal symmetric reflexes. Normal coordination and gait  ECOG PERFORMANCE STATUS: 1 - Symptomatic but completely ambulatory  Blood pressure 166/69, pulse 70, temperature 98 F (36.7 C), temperature source Oral, resp. rate 18, height '5\' 4"'$  (1.626 m), weight 143 lb 3.2 oz (64.955 kg), SpO2 98 %.  LABORATORY DATA: Lab Results  Component Value Date   WBC 6.6 09/06/2015   HGB 14.8 09/06/2015   HCT 44.4 09/06/2015   MCV 94.0 09/06/2015   PLT 205 09/06/2015      Chemistry      Component Value Date/Time   NA 135* 08/29/2015 1137   NA 133* 08/24/2015 0438   K 4.5 08/29/2015 1137   K 4.1 08/24/2015 0438   CL 101 08/24/2015 0438   CO2 26 08/29/2015 1137   CO2 24 08/24/2015 0438   BUN 14.7 08/29/2015 1137   BUN 17 08/24/2015 0438   CREATININE 1.1 08/29/2015 1137   CREATININE 1.11 08/24/2015 0438   CREATININE 0.94 09/22/2013 1018      Component Value Date/Time   CALCIUM 9.7 08/29/2015 1137   CALCIUM 8.5* 08/24/2015 0438   ALKPHOS 63 08/29/2015 1137   AST 31 08/29/2015 1137   ALT 25 08/29/2015 1137   BILITOT 0.95 08/29/2015 1137       RADIOGRAPHIC STUDIES: Dg Chest 2 View  08/14/2015   CLINICAL DATA:  Shortness of breath on exertion for 1 month, hypertension, question pneumonia, former smoker  EXAM: CHEST  2 VIEW  COMPARISON:  None  FINDINGS: Normal heart size.  Atherosclerotic calcification aorta.  Enlargement of RIGHT hilum.  Large LEFT upper lobe focal opacity likely representing mass, 6.3 x 3.9 x 4.9 cm.  Question minimal coexistent infiltrate in LEFT upper lobe as well.  Underlying emphysematous changes.  No pleural effusion or pneumothorax.  IMPRESSION: LEFT upper lobe mass-like opacity highly concerning for pulmonary neoplasm.  Enlargement of RIGHT hilum as well concerning for adenopathy.  Further evaluation by CT chest with contrast recommended.  Findings called to Dr. Doylene Canard On 08/14/2015 at 1513 hours.   Electronically Signed   By: Lavonia Dana M.D.   On:  08/14/2015 15:15   Ct Chest W Contrast  08/22/2015   CLINICAL DATA:  78 year old male with exertional shortness of breath and cough has abnormal chest x-ray. No h/o hemoptysis. No fever or chills.  EXAM: CT CHEST WITH CONTRAST  TECHNIQUE: Multidetector CT imaging of the chest was performed during intravenous contrast administration.  CONTRAST:  172m OMNIPAQUE IOHEXOL 300 MG/ML  SOLN  COMPARISON:  Chest radiograph, 08/14/2015.  FINDINGS: Thoracic inlet and axilla: Several prominent neck base lymph nodes are noted, largest on each side is 8 mm in short axis. There are enlarged left axillary lymph nodes. Largest node measures 14 mm in short axis. Thyroid is unremarkable.  Mediastinum and hila: There is mediastinal and hilar adenopathy. Largest mediastinal node is a precarinal node measuring 19 mm in short axis. Largest right hilar node lies just above the right pulmonary artery measuring 3.0 x 2.5 cm. Largest left hilar node is 1 cm short axis. There is prevascular node anterior to the left hilum measuring 17 mm in short axis. Heart is normal in size and configuration. There are mild coronary artery calcifications. Great vessels are normal in caliber.  Lungs and pleural: There is a pleural-based left upper lobe posterior lateral mass that measures 6.2 x 3.5 x 5.4 cm. There is adjacent hazy ground-glass type opacity and thickening along the adjacent superior  left oblique fissure. There is a well-defined peripheral 8 mm nodule in the right upper lobe, image 25, series 3. There is a 3 mm nodule in the right upper lobe, image 17. No other discrete nodules. Lungs show areas of scarring as well as upper lobe predominant centrilobular emphysema, mild in severity. Additional opacity at the base of the left lower lobe and dependent right lower lobe is likely subsegmental atelectasis. No pleural effusion. No pneumothorax.  Limited upper abdomen: No adrenal masses. Small focal area of enhancement in the posterior segment right  lobe of the liver, nonspecific. 5 mm low-density lesion in the anterior segment of the left lobe of the liver, also nonspecific. Another low-density lesion lies at the dome of the right lobe measuring 5.5 mm. Status post cholecystectomy. Mild chronic dilation of the common bile duct. Low-density left upper pole renal mass consistent with a cyst.  Musculoskeletal: Degenerative anterior osteophytes noted along the thoracic spine. No osteoblastic or osteolytic lesions.  IMPRESSION: 1. Left upper lobe pleural based mass measuring 6.2 cm in greatest dimension. This is concerning for a primary lung malignancy. 2. Bulky mediastinal and hilar adenopathy. Hilar adenopathy is greater on the right than the left, unusual considering that the lung mass is on the left. This may still reflect metastatic adenopathy from lung carcinoma. Lymphoma should also be considered. 3. Mild neck base adenopathy. There is more significant left axillary adenopathy. 4. 8 mm right upper lobe nodule. 3 mm right upper lobe nodule. These may reflect metastatic disease. 5. Mild emphysema are and areas of lung scarring. Mild basilar subsegmental atelectasis.   Electronically Signed   By: Lajean Manes M.D.   On: 08/22/2015 18:24   Mr Jeri Cos EV Contrast  09/05/2015   CLINICAL DATA:  Non-small-cell lung cancer. Staging. History of seizures.  EXAM: MRI HEAD WITHOUT AND WITH CONTRAST  TECHNIQUE: Multiplanar, multiecho pulse sequences of the brain and surrounding structures were obtained without and with intravenous contrast.  CONTRAST:  MultiHance 13 mL.  COMPARISON:  PET scan 09/04/2015.  FINDINGS: No evidence for acute infarction, hemorrhage, mass lesion, hydrocephalus, or extra-axial fluid. Mild global atrophy. Mild subcortical and periventricular T2 and FLAIR hyperintensities, likely chronic microvascular ischemic change. Normal pituitary and cerebellar tonsils.  Moderate cervical spondylosis, with reversal of the normal cervical lordotic curve,  and C3-4 central protrusion resulting in apparent mild cord flattening. Correlate clinically for myelopathy. No osseous disease in the visualized skull base or upper cervical region.  Flow voids are maintained throughout the carotid, basilar, and vertebral arteries. There are no areas of chronic hemorrhage. RIGHT vertebral dominant. LEFT vertebral is patent but non dominant and may be slightly diseased.  High-resolution coronal T2 weighted images demonstrate no significant temporal lobe asymmetry.  Post infusion, no abnormal enhancement of the brain or meninges. No intracranial metastatic deposits are evident. The major dural venous sinuses are patent.  Visualized calvarium, skull base, and upper cervical osseous structures unremarkable. Scalp and extracranial soft tissues, orbits, sinuses, and mastoids show no acute process.  IMPRESSION: No acute intracranial findings.  Mild atrophy and small vessel disease.  No intracranial metastatic disease is observed.  Moderate cervical spondylosis.   Electronically Signed   By: Staci Righter M.D.   On: 09/05/2015 19:19   Nm Myocar Multi W/spect W/wall Motion / Ef  08/23/2015   CLINICAL DATA:  79 year old male with shortness of breath on exertion  EXAM: MYOCARDIAL IMAGING WITH SPECT (REST AND PHARMACOLOGIC-STRESS)  GATED LEFT VENTRICULAR WALL MOTION STUDY  LEFT  VENTRICULAR EJECTION FRACTION  TECHNIQUE: Standard myocardial SPECT imaging was performed after resting intravenous injection of 10 mCi Tc-70msestamibi. Subsequently, intravenous infusion of Lexiscan was performed under the supervision of the Cardiology staff. At peak effect of the drug, 30 mCi Tc-942mestamibi was injected intravenously and standard myocardial SPECT imaging was performed. Quantitative gated imaging was also performed to evaluate left ventricular wall motion, and estimate left ventricular ejection fraction.  COMPARISON:  CT scan of the chest 08/22/2015  FINDINGS: Perfusion: No decreased activity in  the left ventricle on stress imaging to suggest reversible ischemia or infarction. Small region of decreased radiotracer uptake along the inferior and inferolateral wall on both the rest and stress images consistent with diaphragmatic attenuation artifact. There is no associated wall motion abnormality.  Wall Motion: Normal left ventricular wall motion. No left ventricular dilation.  Left Ventricular Ejection Fraction: 76 %  End diastolic volume 61 ml  End systolic volume 14 ml  IMPRESSION: 1. No reversible ischemia or infarction.  2. Normal left ventricular wall motion.  3. Left ventricular ejection fraction 76%  4. Low-risk stress test findings*.  *2012 Appropriate Use Criteria for Coronary Revascularization Focused Update: J Am Coll Cardiol. 203419;62(2):297-989http://content.onairportbarriers.comspx?articleid=[email or web address]   Electronically Signed   By: HeJacqulynn Cadet.D.   On: 08/23/2015 13:14   Nm Pet Image Initial (pi) Skull Base To Thigh  09/04/2015   CLINICAL DATA:  Initial treatment strategy for lung mass, mediastinal adenopathy.  EXAM: NUCLEAR MEDICINE PET SKULL BASE TO THIGH  TECHNIQUE: 12.4 mCi F-18 FDG was injected intravenously. Full-ring PET imaging was performed from the skull base to thigh after the radiotracer. CT data was obtained and used for attenuation correction and anatomic localization.  FASTING BLOOD GLUCOSE:  Value: 89 mg/dl  COMPARISON:  CT chest 08/22/2015.  FINDINGS: NECK  No hypermetabolic lymph nodes in the neck. CT images show no acute findings.  CHEST  Hypermetabolic lymph nodes are seen in the bilateral low internal jugular, left supraclavicular, bilateral mediastinal, bi hilar, left subpectoral and left axillary stations. Index lower right paratracheal lymph node measures 1.9 cm with an SUV max of 12.0. Right hilar adenopathy is difficult to measure without IV contrast but has an SUV max of 15.6. Left upper lobe mass measures 3.7 x 6.7 cm and has an SUV max of 21.6. 7 mm  posterior right upper lobe nodule may be too small for PET resolution. No additional abnormal hypermetabolism in the chest. No pericardial or pleural effusion.  ABDOMEN/PELVIS  No abnormal hypermetabolism in the liver, adrenal glands, spleen or pancreas. No hypermetabolic lymph nodes. Liver is unremarkable. Cholecystectomy. Adrenal glands are unremarkable. Low-attenuation lesions in the kidneys measure up to 2.7 cm on the right, difficult to further characterize without post-contrast imaging. Spleen, pancreas, stomach and bowel are unremarkable. No free fluid. Right inguinal hernia contains fat.  SKELETON  No abnormal osseous hypermetabolism.  IMPRESSION: Hypermetabolic left upper lobe mass with hypermetabolic adenopathy throughout the chest, as described above, consistent with biopsy-proven non-small cell carcinoma (at least T2bN3Mx or stage IIIB disease).   Electronically Signed   By: MeLorin Picket.D.   On: 09/04/2015 15:36   Dg Chest Port 1 View  08/24/2015   CLINICAL DATA:  Hypertension, short of breath  EXAM: PORTABLE CHEST - 1 VIEW  COMPARISON:  Radiograph 08/23/2015  FINDINGS: LEFT upper lobe mass again demonstrated. There is atelectasis in the LEFT lower lobe unchanged. RIGHT hilar adenopathy noted. No pneumothorax.  IMPRESSION: 1. No interval change.  No pneumothorax.  2. LEFT upper lobe mass and RIGHT hilar adenopathy again demonstrated.   Electronically Signed   By: Suzy Bouchard M.D.   On: 08/24/2015 08:33   Dg Chest Port 1 View  08/23/2015   CLINICAL DATA:  Postop lung biopsy.  Shortness of breath.  EXAM: PORTABLE CHEST - 1 VIEW  COMPARISON:  08/14/2015  FINDINGS: There is a left upper lobe lung mass again noted. There is adjacent airspace disease likely reflecting pneumonitis. There is right hilar lymphadenopathy. There is no pleural effusion or pneumothorax. Stable heart size. No acute osseous abnormality.  IMPRESSION: 1. No pneumothorax. 2. Stable left upper lobe pulmonary mass with  adjacent airspace disease. Stable right hilar lymphadenopathy.   Electronically Signed   By: Kathreen Devoid   On: 08/23/2015 21:31    ASSESSMENT AND PLAN: This is a very pleasant 79 years old Asian male recently diagnosed with a stage IV non-small cell lung cancer, squamous cell carcinoma. The recent MRI of the brain showed no evidence for metastatic disease to the brain. His PET scan showed metastatic disease in the low internal jugular and left supraclavicular lymphadenopathy in addition to left axillary lymphadenopathy suspicious for stage IV disease. I showed the images of the PET scan to the patient and his family. I had a lengthy discussion with the patient and his family about his current disease status including palliative care and hospice referral versus systemic chemotherapy with carboplatin and paclitaxel or carboplatin and gemcitabine. I discussed with the patient adverse effect of each treatment regimen, including but not limited to alopecia, myelosuppression, nausea and vomiting, peripheral neuropathy, liver or renal dysfunction. The patient and his family would like some time to think about his treatment options. The expected to call with the decision in the next few days. The patient was advised to call immediately if he has any concerning symptoms in the interval. The patient voices understanding of current disease status and treatment options and is in agreement with the current care plan.  All questions were answered. The patient knows to call the clinic with any problems, questions or concerns. We can certainly see the patient much sooner if necessary.  I spent 20 minutes counseling the patient face to face. The total time spent in the appointment was 30 minutes.  Disclaimer: This note was dictated with voice recognition software. Similar sounding words can inadvertently be transcribed and may not be corrected upon review.

## 2015-09-06 NOTE — Progress Notes (Signed)
Oncology Nurse Navigator Documentation  Oncology Nurse Navigator Flowsheets 09/06/2015  Navigator Encounter Type Clinic/MDC;Other/follow up after staging and dx.  I spoke with patient and family at today's visit.  This was a follow up with staging completed and dx.  Patient and family are not sure about tx plan.  They need some time to think about treatment.  They will call cancer center back once they know what they would like to do.   If they decide to receive treatment, they will be set up for education and FA.    Patient Visit Type Follow-up  Treatment Phase Other  Barriers/Navigation Needs Education  Time Spent with Patient 30

## 2015-09-07 ENCOUNTER — Encounter: Payer: Self-pay | Admitting: Internal Medicine

## 2015-09-07 DIAGNOSIS — C3490 Malignant neoplasm of unspecified part of unspecified bronchus or lung: Secondary | ICD-10-CM | POA: Insufficient documentation

## 2015-09-10 ENCOUNTER — Ambulatory Visit (HOSPITAL_COMMUNITY): Payer: Medicare Other

## 2015-09-11 ENCOUNTER — Telehealth: Payer: Self-pay | Admitting: Medical Oncology

## 2015-09-11 NOTE — Telephone Encounter (Signed)
I referred pt t hospice.

## 2015-09-12 ENCOUNTER — Telehealth: Payer: Self-pay | Admitting: Medical Oncology

## 2015-09-12 NOTE — Telephone Encounter (Signed)
Pt " PTS status"  is 90% so he does not qualify for hospice. Pt going to [place] for 3-4 weeks and they will re-evaluate him when he returns.

## 2015-09-26 ENCOUNTER — Encounter: Payer: Self-pay | Admitting: Medical Oncology

## 2015-09-26 ENCOUNTER — Telehealth: Payer: Self-pay | Admitting: Medical Oncology

## 2015-09-26 NOTE — Progress Notes (Signed)
FMLA form put in Raquel's box.

## 2015-09-26 NOTE — Telephone Encounter (Signed)
Daughter called requesting pain med for her father . I called Jeffrey [name] and he was doing okay just some sob . He is planning to travel to the [place] on October 25th . He denied pain.

## 2015-10-03 ENCOUNTER — Encounter: Payer: Self-pay | Admitting: Internal Medicine

## 2015-10-03 NOTE — Progress Notes (Signed)
I called to let Rose know forms are ready for pick up. fmla

## 2015-10-08 ENCOUNTER — Telehealth: Payer: Self-pay | Admitting: Medical Oncology

## 2015-10-08 NOTE — Telephone Encounter (Signed)
Rose called and asked for pain med for her father in anticipation that he will need pain med before he returns in [place] ( from [place]) .

## 2015-10-09 ENCOUNTER — Telehealth: Payer: Self-pay | Admitting: Medical Oncology

## 2015-10-09 NOTE — Telephone Encounter (Signed)
I told Rose to contact PCP re pain med.

## 2015-10-14 ENCOUNTER — Telehealth: Payer: Self-pay | Admitting: Internal Medicine

## 2015-10-14 NOTE — Telephone Encounter (Signed)
Medical records faxed to Dr. Chancy Milroy [phone]

## 2016-01-22 DEATH — deceased

## 2016-03-25 ENCOUNTER — Encounter: Payer: Self-pay | Admitting: Internal Medicine

## 2016-03-25 NOTE — Progress Notes (Signed)
Faxed done 10/03/15 I sent to medical records

## 2016-10-10 IMAGING — MR MR HEAD WO/W CM
10 of 14 series · 34 of 48 positions shown · IV contrast (Yes)
Comparison: PET scan 09/04/2015.

CLINICAL DATA: Non-small-cell lung cancer. Staging. History of
seizures.

EXAM:
MRI HEAD WITHOUT AND WITH CONTRAST
TECHNIQUE: Multiplanar, multiecho pulse sequences of the brain and surrounding
structures were obtained without and with intravenous contrast.
CONTRAST:  MultiHance 13 mL.

[Series 3: T1 · sagittal · 5.0mm · 0.47mm/px · 1 of 23 slices shown]
[im 1/23]
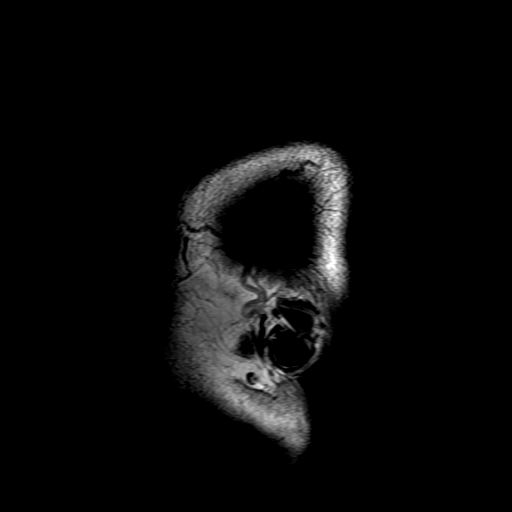

[Series 4: DWI · axial · 3.0mm · 1.09mm/px · z∈[-53,+94]mm · 9 of 100 slices shown (1 of 4)]
[im 1/100]
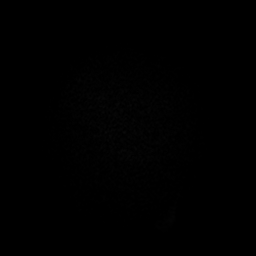
[im 13/100]
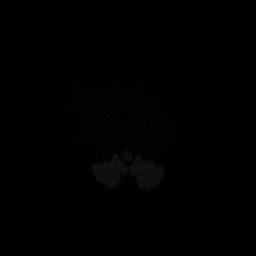
[im 25/100]
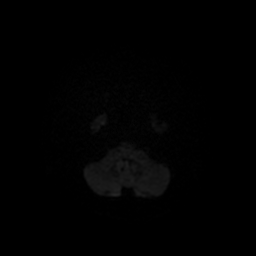
[im 38/100]
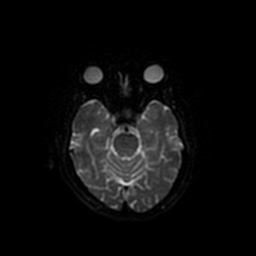
[im 50/100]
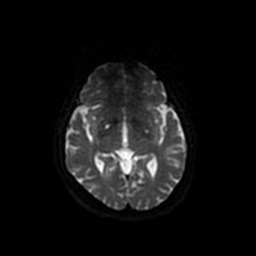
[im 62/100]
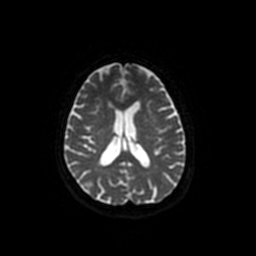
[im 75/100]
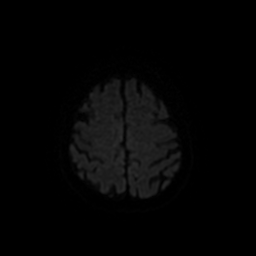
[im 87/100]
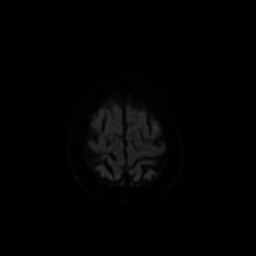
[im 100/100]
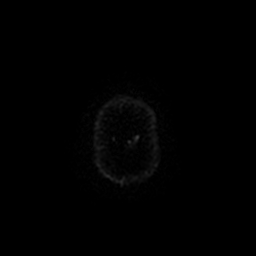

[Series 5: DWI · coronal · 5.0mm · 1.09mm/px · 6 of 66 slices shown (2 of 4)]
[im 1/66]
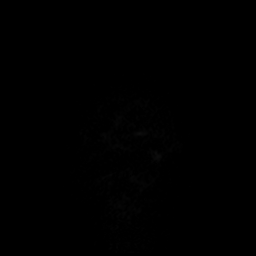
[im 14/66]
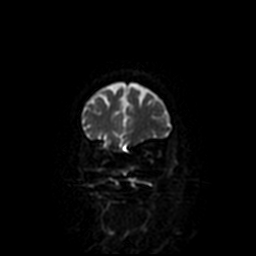
[im 27/66]
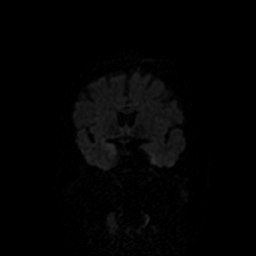
[im 40/66]
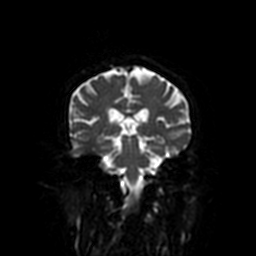
[im 53/66]
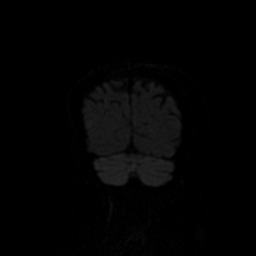
[im 66/66]
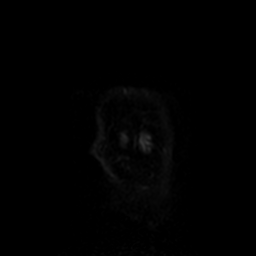

[Series 6: T2 · axial · 5.0mm · 0.43mm/px · z∈[-51,+93]mm · 2 of 25 slices shown (1 of 2)]
[im 1/25]
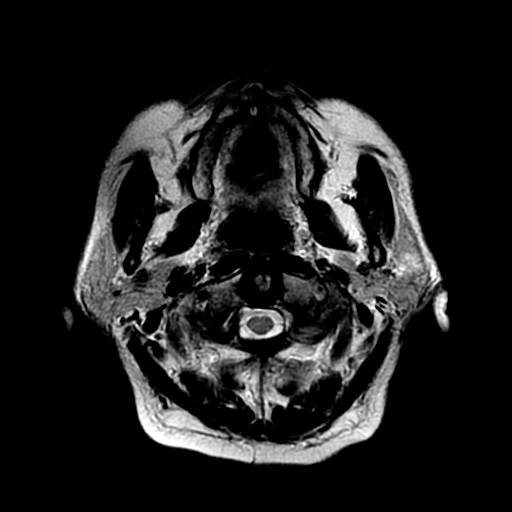
[im 25/25]
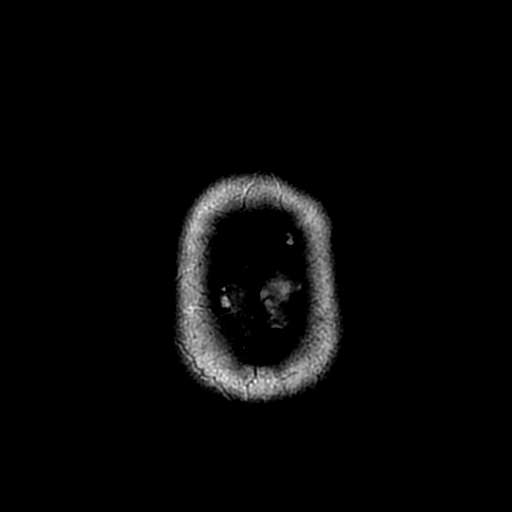

[Series 7: FLAIR · axial · 5.0mm · 0.43mm/px · z∈[-51,+93]mm · 2 of 25 slices shown]
[im 1/25]
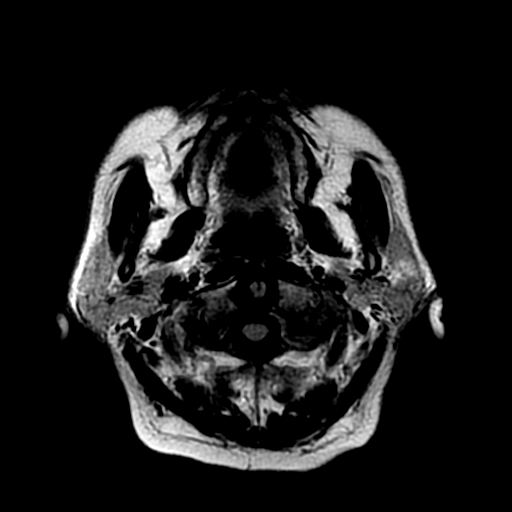
[im 25/25]
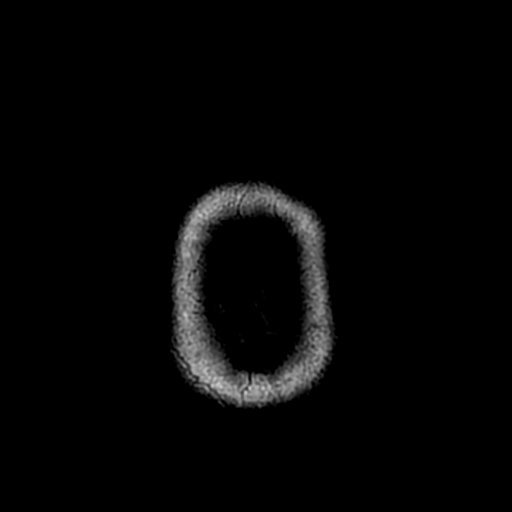

[Series 8: T2 · coronal · 3.0mm · 0.39mm/px · 3 of 33 slices shown (2 of 2)]
[im 1/33]
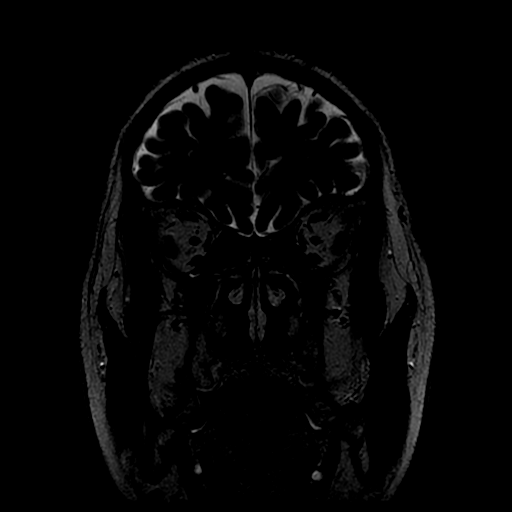
[im 17/33]
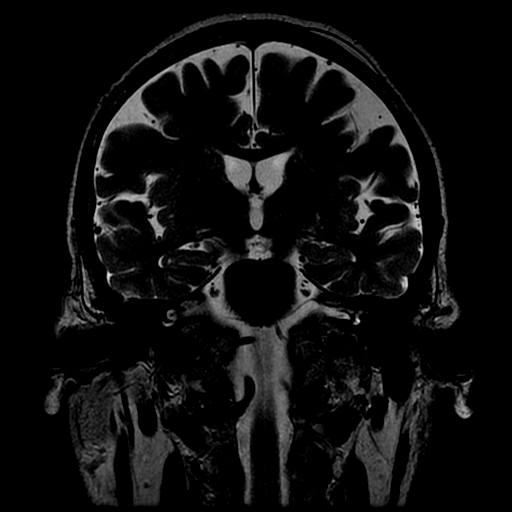
[im 33/33]
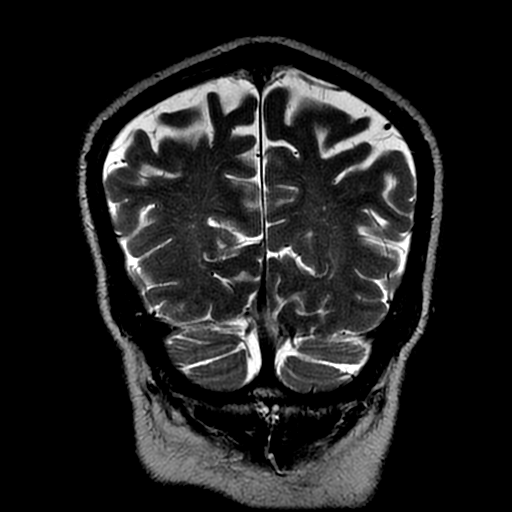

[Series 11: T2 post-contrast · coronal · 5.0mm · 0.45mm/px · 2 of 26 slices shown]
[im 1/26]
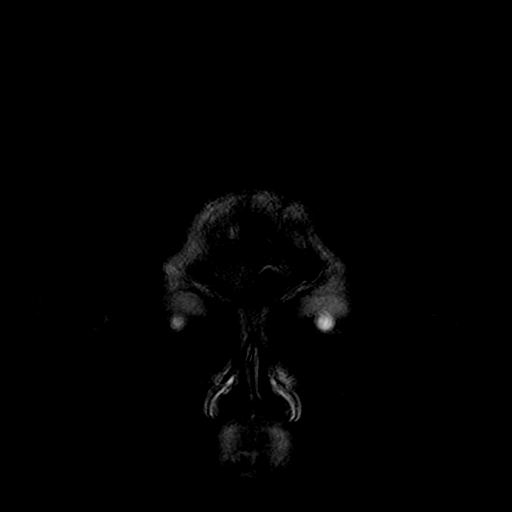
[im 26/26]
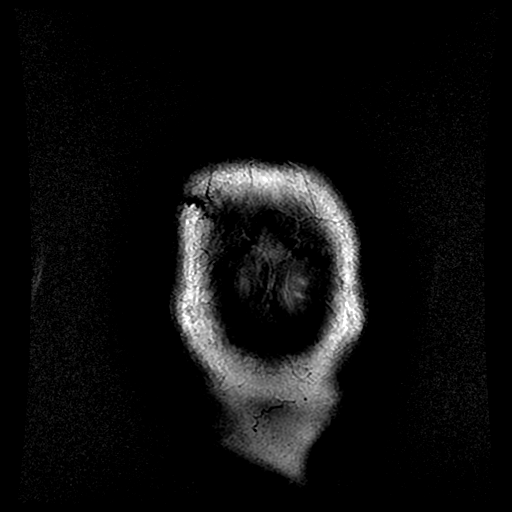

[Series 13: T1 post-contrast · coronal · 5.0mm · 0.45mm/px · 2 of 26 slices shown]
[im 1/26]
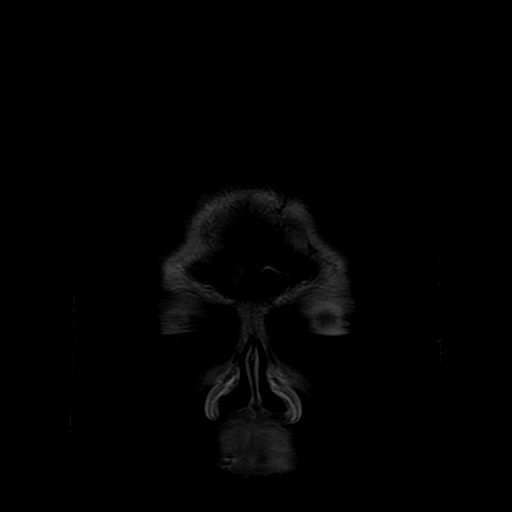
[im 26/26]
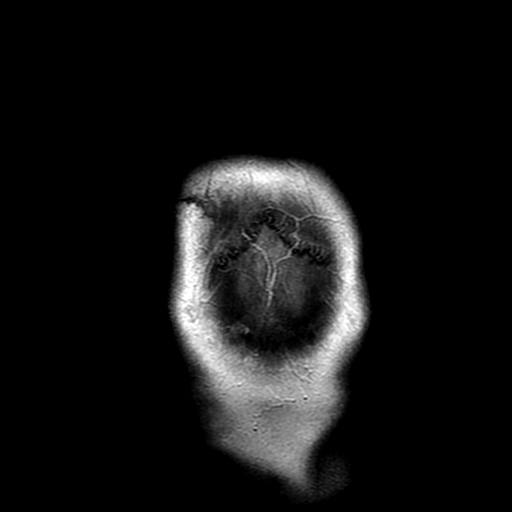

[Series 400: DWI · axial · 3.0mm · 1.09mm/px · z∈[-53,+94]mm · 4 of 50 slices shown (3 of 4)]
[im 1/50]
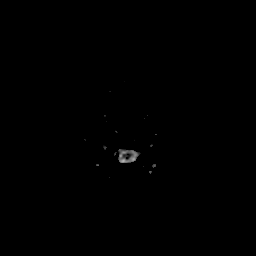
[im 17/50]
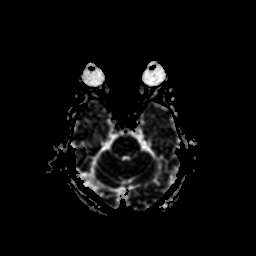
[im 33/50]
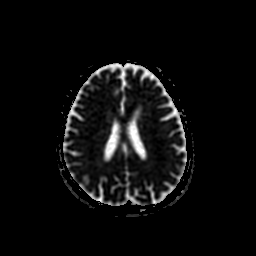
[im 50/50]
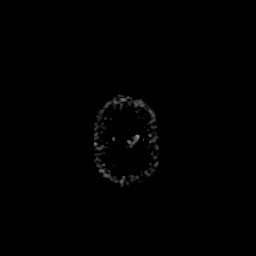

[Series 500: DWI · coronal · 5.0mm · 1.09mm/px · 3 of 33 slices shown (4 of 4)]
[im 1/33]
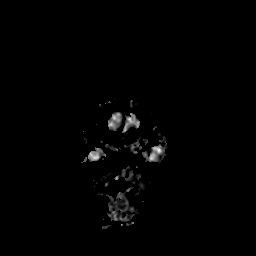
[im 17/33]
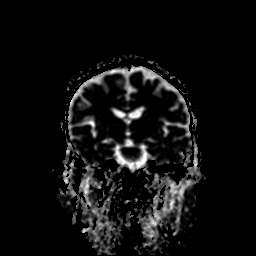
[im 33/33]
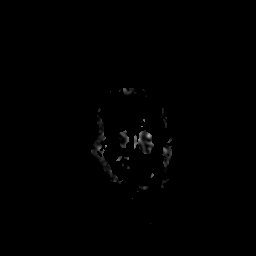

[34 of 48 positions shown; findings below may reference images not displayed]

FINDINGS: No evidence for acute infarction, hemorrhage, mass lesion,
hydrocephalus, or extra-axial fluid. Mild global atrophy. Mild
subcortical and periventricular T2 and FLAIR hyperintensities,
likely chronic microvascular ischemic change. Normal pituitary and
cerebellar tonsils.

Moderate cervical spondylosis, with reversal of the normal cervical
lordotic curve, and C3-4 central protrusion resulting in apparent
mild cord flattening. Correlate clinically for myelopathy. No
osseous disease in the visualized skull base or upper cervical
region.

Flow voids are maintained throughout the carotid, basilar, and
vertebral arteries. There are no areas of chronic hemorrhage. RIGHT
vertebral dominant. LEFT vertebral is patent but non dominant and
may be slightly diseased.

High-resolution coronal T2 weighted images demonstrate no
significant temporal lobe asymmetry.

Post infusion, no abnormal enhancement of the brain or meninges. No
intracranial metastatic deposits are evident. The major dural venous
sinuses are patent.

Visualized calvarium, skull base, and upper cervical osseous
structures unremarkable. Scalp and extracranial soft tissues,
orbits, sinuses, and mastoids show no acute process.
IMPRESSION: No acute intracranial findings.

Mild atrophy and small vessel disease.

No intracranial metastatic disease is observed.

Moderate cervical spondylosis.
